# Patient Record
Sex: Female | Born: 1975 | Race: Black or African American | Hispanic: No | Smoking: Former smoker
Health system: Southern US, Community
[De-identification: ages and names within clinical notes are randomized; demographics above are authoritative.]

## PROBLEM LIST (undated history)

## (undated) DIAGNOSIS — J45909 Unspecified asthma, uncomplicated: Secondary | ICD-10-CM

## (undated) DIAGNOSIS — J40 Bronchitis, not specified as acute or chronic: Secondary | ICD-10-CM

## (undated) DIAGNOSIS — F209 Schizophrenia, unspecified: Secondary | ICD-10-CM

## (undated) DIAGNOSIS — F419 Anxiety disorder, unspecified: Secondary | ICD-10-CM

## (undated) DIAGNOSIS — F32A Depression, unspecified: Secondary | ICD-10-CM

## (undated) HISTORY — DX: Schizophrenia, unspecified: F20.9

---

## 2001-01-25 ENCOUNTER — Other Ambulatory Visit: Admission: RE | Admit: 2001-01-25 | Discharge: 2001-01-25 | Payer: Self-pay | Admitting: Obstetrics and Gynecology

## 2001-12-29 ENCOUNTER — Ambulatory Visit (HOSPITAL_COMMUNITY): Admission: RE | Admit: 2001-12-29 | Discharge: 2001-12-29 | Payer: Self-pay | Admitting: Obstetrics and Gynecology

## 2002-03-17 ENCOUNTER — Emergency Department (HOSPITAL_COMMUNITY): Admission: EM | Admit: 2002-03-17 | Discharge: 2002-03-17 | Payer: Self-pay | Admitting: Internal Medicine

## 2004-02-03 ENCOUNTER — Ambulatory Visit (HOSPITAL_COMMUNITY): Admission: RE | Admit: 2004-02-03 | Discharge: 2004-02-03 | Payer: Self-pay | Admitting: Family Medicine

## 2009-01-29 ENCOUNTER — Emergency Department (HOSPITAL_COMMUNITY): Admission: EM | Admit: 2009-01-29 | Discharge: 2009-01-29 | Payer: Self-pay | Admitting: Emergency Medicine

## 2011-01-22 NOTE — Op Note (Signed)
[facility]  Patient:    [name], [name] Visit Number: [id number] MRN: [id number]          Service Type: DSU Location: DAY Attending Physician:  Tilda Burrow Dictated by:   Amy Rowe, M.D. Proc. Date: 12/29/01 Admit Date:  12/29/2001 Discharge Date: 12/29/2001   CC:         Amy Rowe, M.D.   Operative Report  PREOPERATIVE DIAGNOSIS:  Elective sterilization.  POSTOPERATIVE DIAGNOSIS:  Elective sterilization.  OPERATION/PROCEDURE:  Laparoscopic tubal sterilization with Falope rings.  SURGEON:  Amy Rowe, M.D.  ASSISTANT:  None.  ANESTHESIA:  General.  COMPLICATIONS:  None.  ESTIMATED BLOOD LOSS:  Minimal.  INDICATION:  Elective permanent sterilization.  DETAILS OF PROCEDURE:  The patient was taken to the operating room, prepped and draped for a combined abdominal and vaginal procedure, with Hulka tenaculum attached to the cervix for uterine manipulation. Bladder in-and-out catheterization. An infraumbilical, 1 cm vertical incision, as well as a transverse suprapubic 1 cm incision. Veress needle was used to introduce pneumoperitoneum through the umbilical incision with the pneumoperitoneum easily introduced under 10 mmHg of pressure. Introduction of the Veress needle was done, carefully elevating the abdominal wall and orienting the needle toward the pelvis.  The laparoscopic trocar was then carefully introduced into the abdomen using a similar technique, and the laparoscope was used to visualize normal pelvic anatomy with no evidence of bleeding or trauma. The suprapubic trocar was introduced under direct visualization, and then attention was directed to the left fallopian tube, which was identified up to its fimbriated end, elevated and a mid-segment loop of the tube was drawn up into the Falope ring applier, Marcaine 0.25% applied to the surface of the tube and the Falope ring applied, inspected, and found to be in  satisfactory position. The opposite tube was then treated in a similar fashion. The mesosalpinx beneath the Falope ring on each side was then infiltrated with approximately 3 cc of Marcaine 0.25%, using a transabdominal approach with a 22-gauge spinal needle.  Then, the laparoscopic equipment was removed after instilling 200 cc of saline into the abdomen and deflating the abdomen. Subcuticular 4-0 Dexon was used to close the skin incisions and Steri-Strips was placed on the skin surface. Sponge and needle counts were correct. The patient tolerated the procedure well, was awakened, and went to the recovery room in good condition. Dictated by:   Amy Rowe, M.D. Attending Physician:  Tilda Burrow DD:  01/22/02 TD:  01/23/02 Job: [id number]

## 2012-12-23 ENCOUNTER — Encounter (HOSPITAL_COMMUNITY): Payer: Self-pay | Admitting: Emergency Medicine

## 2012-12-23 ENCOUNTER — Emergency Department (HOSPITAL_COMMUNITY)
Admission: EM | Admit: 2012-12-23 | Discharge: 2012-12-23 | Disposition: A | Discharge status: Home / Self Care | Payer: Medicaid Other | Attending: Emergency Medicine | Admitting: Emergency Medicine

## 2012-12-23 DIAGNOSIS — J45909 Unspecified asthma, uncomplicated: Secondary | ICD-10-CM | POA: Insufficient documentation

## 2012-12-23 DIAGNOSIS — S61219A Laceration without foreign body of unspecified finger without damage to nail, initial encounter: Secondary | ICD-10-CM

## 2012-12-23 DIAGNOSIS — Y929 Unspecified place or not applicable: Secondary | ICD-10-CM | POA: Insufficient documentation

## 2012-12-23 DIAGNOSIS — S61209A Unspecified open wound of unspecified finger without damage to nail, initial encounter: Secondary | ICD-10-CM | POA: Insufficient documentation

## 2012-12-23 DIAGNOSIS — Y9389 Activity, other specified: Secondary | ICD-10-CM | POA: Insufficient documentation

## 2012-12-23 DIAGNOSIS — W268XXA Contact with other sharp object(s), not elsewhere classified, initial encounter: Secondary | ICD-10-CM | POA: Insufficient documentation

## 2012-12-23 DIAGNOSIS — Z23 Encounter for immunization: Secondary | ICD-10-CM | POA: Insufficient documentation

## 2012-12-23 DIAGNOSIS — Z79899 Other long term (current) drug therapy: Secondary | ICD-10-CM | POA: Insufficient documentation

## 2012-12-23 HISTORY — DX: Bronchitis, not specified as acute or chronic: J40

## 2012-12-23 HISTORY — DX: Unspecified asthma, uncomplicated: J45.909

## 2012-12-23 MED ORDER — TETANUS-DIPHTH-ACELL PERTUSSIS 5-2.5-18.5 LF-MCG/0.5 IM SUSP
0.5000 mL | Freq: Once | INTRAMUSCULAR | Status: AC
  Administered 2012-12-23: 0.5 mL via INTRAMUSCULAR
  Filled 2012-12-23: qty 0.5

## 2012-12-23 MED ORDER — CEPHALEXIN 500 MG PO CAPS: 500.0000 mg | ORAL_CAPSULE | Freq: Four times a day (QID) | ORAL | Status: DC

## 2012-12-23 MED ORDER — IBUPROFEN 800 MG PO TABS
800.0000 mg | ORAL_TABLET | Freq: Once | ORAL | Status: AC
  Administered 2012-12-23: 800 mg via ORAL
  Filled 2012-12-23: qty 1

## 2012-12-23 MED ORDER — IBUPROFEN 800 MG PO TABS: 800.0000 mg | ORAL_TABLET | Freq: Three times a day (TID) | ORAL | Status: DC

## 2012-12-23 NOTE — ED Notes (Signed)
Pt presents with small laceration to left thumb sustained while attempting to fix a toy for child. Bleeding controlled. Unknown last tetanus booster.  Steri strips applied by EDP. Pt tolerated well.

## 2012-12-23 NOTE — ED Notes (Signed)
Patient reports cut left thumb tonight while using knife to fix something.

## 2012-12-24 NOTE — ED Provider Notes (Signed)
History     CSN: [id number]  Arrival date & time 12/23/12  2140   First MD Initiated Contact with Patient 12/23/12 2158      Chief Complaint  Patient presents with  . Extremity Laceration    (Consider location/radiation/quality/duration/timing/severity/associated sxs/prior treatment) Patient is a 37 y.o. female presenting with skin laceration. The history is provided by the patient.  Laceration Location:  Finger Finger laceration location:  L thumb Length (cm):  2 Depth:  Cutaneous Quality: straight   Bleeding: controlled   Time since incident:  2 hours Laceration mechanism:  Knife Pain details:    Quality:  Throbbing   Severity:  Mild   Timing:  Constant   Progression:  Unchanged Foreign body present:  No foreign bodies Relieved by:  Pressure Worsened by:  Movement Ineffective treatments:  None tried Tetanus status:  Out of date   Past Medical History  Diagnosis Date  . Asthma   . Bronchitis     Past Surgical History  Procedure Laterality Date  . Cesarean section      History reviewed. No pertinent family history.  History  Substance Use Topics  . Smoking status: Never Smoker   . Smokeless tobacco: Not on file  . Alcohol Use: Yes     Comment: socially    OB History   Grav Para Term Preterm Abortions TAB SAB Ect Mult Living                  Review of Systems  Constitutional: Negative for fever and chills.  Musculoskeletal: Negative for back pain, joint swelling and arthralgias.  Skin: Positive for wound.       Laceration   Neurological: Negative for dizziness, weakness and numbness.  Hematological: Does not bruise/bleed easily.  All other systems reviewed and are negative.    Allergies  Latex  Home Medications   Current Outpatient Rx  Name  Route  Sig  Dispense  Refill  . albuterol (PROVENTIL HFA) 108 (90 BASE) MCG/ACT inhaler   Inhalation   Inhale 2 puffs into the lungs every 6 (six) hours as needed for wheezing or shortness of  breath.         . cephALEXin (KEFLEX) 500 MG capsule   Oral   Take 1 capsule (500 mg total) by mouth 4 (four) times daily. For 7 days   28 capsule   0   . ibuprofen (ADVIL,MOTRIN) 800 MG tablet   Oral   Take 1 tablet (800 mg total) by mouth 3 (three) times daily. Take with food   21 tablet   0     BP 132/77  Pulse 80  Temp(Src) 98.5 F (36.9 C) (Oral)  Resp 20  Ht 5\' 3"  (1.6 m)  Wt 201 lb 9.6 oz (91.445 kg)  BMI 35.72 kg/m2  SpO2 95%  LMP 12/13/2012  Physical Exam  Nursing note and vitals reviewed. Constitutional: She is oriented to person, place, and time. She appears well-developed and well-nourished. No distress.  HENT:  Head: Normocephalic and atraumatic.  Cardiovascular: Normal rate, regular rhythm, normal heart sounds and intact distal pulses.   No murmur heard. Pulmonary/Chest: Effort normal and breath sounds normal. No respiratory distress.  Musculoskeletal: She exhibits no edema and no tenderness.       Left hand: She exhibits decreased range of motion, tenderness and laceration. She exhibits no bony tenderness, normal two-point discrimination, normal capillary refill, no deformity and no swelling. Normal sensation noted. Decreased strength noted. She exhibits finger abduction. She  exhibits no wrist extension trouble.       Hands: Neurological: She is alert and oriented to person, place, and time. She exhibits normal muscle tone. Coordination normal.  Skin: Laceration noted.    ED Course  Procedures (including critical care time)  Labs Reviewed - No data to display No results found.   1. Finger laceration involving tendon, initial encounter       MDM    LACERATION REPAIR Performed by: Jesiah Grismer L. Authorized by: Maxwell Caul Consent: Verbal consent obtained. Risks and benefits: risks, benefits and alternatives were discussed Consent given by: patient Patient identity confirmed: provided demographic data  Wound  explored  Laceration Location: dorsal left thumb Laceration Length: 2 cm  No Foreign Bodies seen or palpated  Anesthesia: none  Irrigation method: syringe Amount of cleaning: standard  Skin closure: steri-strips Number : 2   Lac bandaged and splinted, pain improved remains NV intact  Patient tolerance: Patient tolerated the procedure well with no immediate complications.    Small lac to the dorsal left thumb over the distal phalanx.  Patient unable to fully extend the thumb.  Distal sensation is intact.  CR< 2 sec.  Laceration appears superficial but concerning for possible tendon injury although not visualized on exam.  I will splint the thumb, start abx and pt agrees to close f/u next week with orthopedics.  I have discussed exam findings with the patient and she verbalized understanding of instructions and agrees to care plan.     Tayen Narang L. Trisha Mangle, PA-C 12/24/12 2139

## 2012-12-27 NOTE — ED Provider Notes (Signed)
Medical screening examination/treatment/procedure(s) were performed by non-physician practitioner and as supervising physician I was immediately available for consultation/collaboration. Devoria Albe, MD, Armando Gang   Ward Givens, MD 12/27/12 1052

## 2013-06-05 ENCOUNTER — Encounter (HOSPITAL_COMMUNITY): Payer: Self-pay

## 2013-06-05 ENCOUNTER — Emergency Department (HOSPITAL_COMMUNITY)
Admission: EM | Admit: 2013-06-05 | Discharge: 2013-06-05 | Disposition: A | Discharge status: Home / Self Care | Payer: Medicaid Other | Attending: Emergency Medicine | Admitting: Emergency Medicine

## 2013-06-05 DIAGNOSIS — Z791 Long term (current) use of non-steroidal anti-inflammatories (NSAID): Secondary | ICD-10-CM | POA: Insufficient documentation

## 2013-06-05 DIAGNOSIS — F410 Panic disorder [episodic paroxysmal anxiety] without agoraphobia: Secondary | ICD-10-CM | POA: Insufficient documentation

## 2013-06-05 DIAGNOSIS — R079 Chest pain, unspecified: Secondary | ICD-10-CM | POA: Insufficient documentation

## 2013-06-05 DIAGNOSIS — J45901 Unspecified asthma with (acute) exacerbation: Secondary | ICD-10-CM | POA: Insufficient documentation

## 2013-06-05 DIAGNOSIS — Z79899 Other long term (current) drug therapy: Secondary | ICD-10-CM | POA: Insufficient documentation

## 2013-06-05 DIAGNOSIS — Z91040 Latex allergy status: Secondary | ICD-10-CM | POA: Insufficient documentation

## 2013-06-05 HISTORY — DX: Anxiety disorder, unspecified: F41.9

## 2013-06-05 MED ORDER — LORAZEPAM 1 MG PO TABS
1.0000 mg | ORAL_TABLET | Freq: Once | ORAL | Status: AC
  Administered 2013-06-05: 1 mg via ORAL
  Filled 2013-06-05: qty 1

## 2013-06-05 MED ORDER — LORAZEPAM 1 MG PO TABS: 1.0000 mg | ORAL_TABLET | Freq: Three times a day (TID) | ORAL | Status: DC | PRN

## 2013-06-05 NOTE — ED Notes (Signed)
Pt alert & oriented x4, stable gait. Patient given discharge instructions, paperwork & prescription(s). Patient  instructed to stop at the registration desk to finish any additional paperwork. Patient verbalized understanding. Pt left department w/ no further questions. 

## 2013-06-05 NOTE — ED Notes (Signed)
Pt admits to ems that she is having issues with teenage children and with hx of panic attacks in the past, felt like she needed to get out of the situation.

## 2013-06-05 NOTE — ED Provider Notes (Signed)
CSN: [id number]     Arrival date & time 06/05/13  1920 History   First MD Initiated Contact with Patient 06/05/13 1939     Chief Complaint  Patient presents with  . Panic Attack    Patient is a 37 y.o. female presenting with anxiety. The history is provided by the patient.  Anxiety This is a recurrent problem. The current episode started less than 1 hour ago. The problem occurs constantly. The problem has been gradually improving. Associated symptoms include chest pain and shortness of breath. The symptoms are aggravated by stress. The symptoms are relieved by rest.  pt presents from home for panic attack Pt reports increased stress at home and due to this she started to hyperventilate, she felt SOB and had chest tightness.  She had to lay down due to her anxiety.  No injury reported She is now improved She denies SI.  She reports she feels safe at home She reports h/o panic attacks in the past  Her children are at bedside and they confirm story  Past Medical History  Diagnosis Date  . Asthma   . Bronchitis   . Anxiety    Past Surgical History  Procedure Laterality Date  . Cesarean section     No family history on file. History  Substance Use Topics  . Smoking status: Never Smoker   . Smokeless tobacco: Not on file  . Alcohol Use: Yes     Comment: socially   OB History   Grav Para Term Preterm Abortions TAB SAB Ect Mult Living                 Review of Systems  Respiratory: Positive for shortness of breath.   Cardiovascular: Positive for chest pain.  Gastrointestinal: Negative for vomiting.  Psychiatric/Behavioral: Negative for suicidal ideas. The patient is nervous/anxious.   All other systems reviewed and are negative.    Allergies  Latex  Home Medications   Current Outpatient Rx  Name  Route  Sig  Dispense  Refill  . albuterol (PROVENTIL HFA) 108 (90 BASE) MCG/ACT inhaler   Inhalation   Inhale 2 puffs into the lungs every 6 (six) hours as needed for  wheezing or shortness of breath.         . cephALEXin (KEFLEX) 500 MG capsule   Oral   Take 1 capsule (500 mg total) by mouth 4 (four) times daily. For 7 days   28 capsule   0   . ibuprofen (ADVIL,MOTRIN) 800 MG tablet   Oral   Take 1 tablet (800 mg total) by mouth 3 (three) times daily. Take with food   21 tablet   0    BP 103/71  Pulse 59  Temp(Src) 98.6 F (37 C) (Oral)  Resp 20  Ht 5\' 3"  (1.6 m)  Wt 201 lb (91.173 kg)  BMI 35.61 kg/m2  SpO2 100%  LMP 06/04/2013  Physical Exam CONSTITUTIONAL: Well developed/well nourished HEAD: Normocephalic/atraumatic EYES: EOMI/PERRL ENMT: Mucous membranes moist NECK: supple no meningeal signs SPINE:entire spine nontender CV: S1/S2 noted, no murmurs/rubs/gallops noted LUNGS: Lungs are clear to auscultation bilaterally, no apparent distress ABDOMEN: soft, nontender, no rebound or guarding GU:no cva tenderness NEURO: Pt is awake/alert, moves all extremitiesx4 EXTREMITIES: pulses normal, full ROM SKIN: warm, color normal PSYCH: mildly anxious  ED Course  Procedures  Pt with panic attack at home due to family stress (reports before episode she was baseline) I doubt ACS/PE as cause of her symptoms Now that she is  in hospital, her anxiety is resolving short course of ativan ordered.   She feels safe for d/c home  MDM  No diagnosis found. Nursing notes including past medical history and social history reviewed and considered in documentation    Date: 06/05/2013  Rate: 62  Rhythm: normal sinus rhythm  QRS Axis: normal  Intervals: normal  ST/T Wave abnormalities: nonspecific ST changes  Conduction Disutrbances:none  Narrative Interpretation:   Old EKG Reviewed: none available at time of interpretation    Joya Gaskins, MD 06/05/13 2151

## 2014-02-22 ENCOUNTER — Emergency Department (HOSPITAL_COMMUNITY)
Admission: EM | Admit: 2014-02-22 | Discharge: 2014-02-22 | Disposition: A | Discharge status: Home / Self Care | Payer: MEDICAID | Attending: Emergency Medicine | Admitting: Emergency Medicine

## 2014-02-22 ENCOUNTER — Encounter (HOSPITAL_COMMUNITY): Payer: Self-pay | Admitting: Emergency Medicine

## 2014-02-22 DIAGNOSIS — Z79899 Other long term (current) drug therapy: Secondary | ICD-10-CM | POA: Insufficient documentation

## 2014-02-22 DIAGNOSIS — F410 Panic disorder [episodic paroxysmal anxiety] without agoraphobia: Secondary | ICD-10-CM | POA: Insufficient documentation

## 2014-02-22 DIAGNOSIS — Z91040 Latex allergy status: Secondary | ICD-10-CM | POA: Diagnosis not present

## 2014-02-22 DIAGNOSIS — J45909 Unspecified asthma, uncomplicated: Secondary | ICD-10-CM | POA: Insufficient documentation

## 2014-02-22 MED ORDER — LORAZEPAM 1 MG PO TABS
1.0000 mg | ORAL_TABLET | Freq: Once | ORAL | Status: AC
  Administered 2014-02-22: 1 mg via ORAL
  Filled 2014-02-22: qty 1

## 2014-02-22 NOTE — ED Notes (Signed)
Pt. Appears anxious and tearful. Pt. Appears restless. Pt. Reports taking celexa tonight and drinking alcohol. Pt. Denies SI and HI. [facility]

## 2014-02-22 NOTE — ED Provider Notes (Deleted)
CSN: [id number]     Arrival date & time 02/22/14  0215 History   First MD Initiated Contact with Patient 02/22/14 0222     Chief Complaint  Patient presents with  . Panic Attack      HPI Patient reports she is become overwhelmed with distress her life.  This came to have this evening.  She presents tearful and anxious.  She denies chest pain shortness of breath.  She has no homicidal or suicidal thoughts.  She denies recent illness.  She states no one is physically, verbally, sexually abusing her.  She has a safe place to stay.  She does admit to drinking alcohol this evening he reports having 2 drinks.   Past Medical History  Diagnosis Date  . Asthma   . Bronchitis   . Anxiety    Past Surgical History  Procedure Laterality Date  . Cesarean section     No family history on file. History  Substance Use Topics  . Smoking status: Never Smoker   . Smokeless tobacco: Not on file  . Alcohol Use: Yes     Comment: socially   OB History   Grav Para Term Preterm Abortions TAB SAB Ect Mult Living                 Review of Systems  All other systems reviewed and are negative.     Allergies  Other and Latex  Home Medications   Prior to Admission medications   Medication Sig Start Date End Date Taking? Authorizing Provider  citalopram (CELEXA) 20 MG tablet Take 20 mg by mouth daily.   Yes Historical Provider, MD  hydrOXYzine (ATARAX/VISTARIL) 25 MG tablet Take 25 mg by mouth 3 (three) times daily as needed.   Yes Historical Provider, MD  traZODone (DESYREL) 100 MG tablet Take 100 mg by mouth at bedtime.   Yes Historical Provider, MD   BP 140/86  Pulse 86  Temp(Src) 97.8 F (36.6 C) (Oral)  Resp 18  Ht 5\' 9"  (1.753 m)  Wt 209 lb (94.802 kg)  BMI 30.85 kg/m2  SpO2 99% Physical Exam  Nursing note and vitals reviewed. Constitutional: She is oriented to person, place, and time. She appears well-developed and well-nourished. No distress.  HENT:  Head: Normocephalic and  atraumatic.  Eyes: EOM are normal.  Neck: Normal range of motion.  Cardiovascular: Normal rate, regular rhythm and normal heart sounds.   Pulmonary/Chest: Effort normal and breath sounds normal.  Abdominal: Soft. She exhibits no distension. There is no tenderness.  Musculoskeletal: Normal range of motion.  Neurological: She is alert and oriented to person, place, and time.  Skin: Skin is warm and dry.  Psychiatric:  tearful    ED Course  Procedures (including critical care time) Labs Review Labs Reviewed - No data to display  Imaging Review No results found.   EKG Interpretation None      MDM   Final diagnoses:  Panic attack    3:16 AM Pt feeling better at this time. Dc home. Panic attack. Will follow up with her outpatient therapist/psychiatric services today    Lyanne CoKevin M Campos, MD 02/22/14 [phone]

## 2014-02-22 NOTE — ED Provider Notes (Signed)
CSN: [id number]     Arrival date & time 02/22/14  0215 History   First MD Initiated Contact with Patient 02/22/14 0222     Chief Complaint  Patient presents with  . Panic Attack     HPI Patient presents the emergency department stay she feels overwhelmed with all the stress in her life.  She is tearful and anxious at this time.  She is taking the medications prescribed by her therapist and psychiatrist which include Celexa, trazodone, Vistaril.  As reported she had 2 drinks of alcohol this evening.  No homicidal or suicidal thoughts.  No pain.  No complaints otherwise.   Past Medical History  Diagnosis Date  . Asthma   . Bronchitis   . Anxiety    Past Surgical History  Procedure Laterality Date  . Cesarean section     No family history on file. History  Substance Use Topics  . Smoking status: Never Smoker   . Smokeless tobacco: Not on file  . Alcohol Use: Yes     Comment: socially   OB History   Grav Para Term Preterm Abortions TAB SAB Ect Mult Living                 Review of Systems  All other systems reviewed and are negative.     Allergies  Other and Latex  Home Medications   Prior to Admission medications   Medication Sig Start Date End Date Taking? Authorizing Provider  citalopram (CELEXA) 20 MG tablet Take 20 mg by mouth daily.   Yes Historical Provider, MD  hydrOXYzine (ATARAX/VISTARIL) 25 MG tablet Take 25 mg by mouth 3 (three) times daily as needed.   Yes Historical Provider, MD  traZODone (DESYREL) 100 MG tablet Take 100 mg by mouth at bedtime.   Yes Historical Provider, MD   BP 140/86  Pulse 86  Temp(Src) 97.8 F (36.6 C) (Oral)  Resp 18  Ht 5\' 9"  (1.753 m)  Wt 209 lb (94.802 kg)  BMI 30.85 kg/m2  SpO2 99% Physical Exam  Nursing note and vitals reviewed. Constitutional: She is oriented to person, place, and time. She appears well-developed and well-nourished. No distress.  HENT:  Head: Normocephalic and atraumatic.  Eyes: EOM are normal.   Neck: Normal range of motion.  Cardiovascular: Normal rate, regular rhythm and normal heart sounds.   Pulmonary/Chest: Effort normal and breath sounds normal.  Abdominal: Soft. She exhibits no distension. There is no tenderness.  Musculoskeletal: Normal range of motion.  Neurological: She is alert and oriented to person, place, and time.  Skin: Skin is warm and dry.  Psychiatric:  Tearful and anxious    ED Course  Procedures (including critical care time) Labs Review Labs Reviewed - No data to display  Imaging Review No results found.   EKG Interpretation None      MDM   Final diagnoses:  Panic attack    3:14 AM Improvement in symptoms. Pt will follow up with her therapist/psychiatric team today    Lyanne CoKevin M Campos, MD 02/22/14 [phone]

## 2014-02-22 NOTE — ED Notes (Signed)
Pt. No longer tearful. Pt. Does not appear anxious. Pt. Reports she will follow-up with [facility] tomorrow.

## 2015-08-13 ENCOUNTER — Inpatient Hospital Stay (HOSPITAL_COMMUNITY)
Admission: AD | Admit: 2015-08-13 | Discharge: 2015-08-19 | DRG: 885 | Disposition: A | Discharge status: Home / Self Care | Payer: No Typology Code available for payment source | Source: Intra-hospital | Attending: Psychiatry | Admitting: Psychiatry

## 2015-08-13 ENCOUNTER — Emergency Department (HOSPITAL_COMMUNITY)
Admission: EM | Admit: 2015-08-13 | Discharge: 2015-08-13 | Disposition: A | Discharge status: Other Acute Inpatient Hospital | Payer: Medicaid Other | Attending: Emergency Medicine | Admitting: Emergency Medicine

## 2015-08-13 ENCOUNTER — Encounter (HOSPITAL_COMMUNITY): Payer: Self-pay

## 2015-08-13 DIAGNOSIS — F339 Major depressive disorder, recurrent, unspecified: Secondary | ICD-10-CM | POA: Diagnosis not present

## 2015-08-13 DIAGNOSIS — R45851 Suicidal ideations: Secondary | ICD-10-CM | POA: Diagnosis present

## 2015-08-13 DIAGNOSIS — G4700 Insomnia, unspecified: Secondary | ICD-10-CM | POA: Diagnosis present

## 2015-08-13 DIAGNOSIS — F329 Major depressive disorder, single episode, unspecified: Secondary | ICD-10-CM | POA: Insufficient documentation

## 2015-08-13 DIAGNOSIS — F332 Major depressive disorder, recurrent severe without psychotic features: Secondary | ICD-10-CM | POA: Diagnosis not present

## 2015-08-13 DIAGNOSIS — F410 Panic disorder [episodic paroxysmal anxiety] without agoraphobia: Secondary | ICD-10-CM | POA: Diagnosis present

## 2015-08-13 DIAGNOSIS — Z91040 Latex allergy status: Secondary | ICD-10-CM | POA: Insufficient documentation

## 2015-08-13 DIAGNOSIS — E876 Hypokalemia: Secondary | ICD-10-CM | POA: Insufficient documentation

## 2015-08-13 DIAGNOSIS — J45909 Unspecified asthma, uncomplicated: Secondary | ICD-10-CM | POA: Insufficient documentation

## 2015-08-13 DIAGNOSIS — F419 Anxiety disorder, unspecified: Secondary | ICD-10-CM | POA: Insufficient documentation

## 2015-08-13 DIAGNOSIS — Z3202 Encounter for pregnancy test, result negative: Secondary | ICD-10-CM | POA: Insufficient documentation

## 2015-08-13 DIAGNOSIS — Z79899 Other long term (current) drug therapy: Secondary | ICD-10-CM | POA: Insufficient documentation

## 2015-08-13 DIAGNOSIS — K5900 Constipation, unspecified: Secondary | ICD-10-CM | POA: Diagnosis present

## 2015-08-13 DIAGNOSIS — F209 Schizophrenia, unspecified: Secondary | ICD-10-CM | POA: Diagnosis present

## 2015-08-13 DIAGNOSIS — F32A Depression, unspecified: Secondary | ICD-10-CM

## 2015-08-13 LAB — CBC WITH DIFFERENTIAL/PLATELET
BASOS ABS: 0 10*3/uL (ref 0.0–0.1)
BASOS PCT: 0 %
Eosinophils Absolute: 0 10*3/uL (ref 0.0–0.7)
Eosinophils Relative: 1 %
HEMATOCRIT: 41 % (ref 36.0–46.0)
HEMOGLOBIN: 14 g/dL (ref 12.0–15.0)
Lymphocytes Relative: 40 %
Lymphs Abs: 2.2 10*3/uL (ref 0.7–4.0)
MCH: 28.7 pg (ref 26.0–34.0)
MCHC: 34.1 g/dL (ref 30.0–36.0)
MCV: 84.2 fL (ref 78.0–100.0)
MONOS PCT: 5 %
Monocytes Absolute: 0.3 10*3/uL (ref 0.1–1.0)
NEUTROS ABS: 3 10*3/uL (ref 1.7–7.7)
NEUTROS PCT: 54 %
Platelets: 228 10*3/uL (ref 150–400)
RBC: 4.87 MIL/uL (ref 3.87–5.11)
RDW: 14.2 % (ref 11.5–15.5)
WBC: 5.5 10*3/uL (ref 4.0–10.5)

## 2015-08-13 LAB — URINALYSIS, ROUTINE W REFLEX MICROSCOPIC
BILIRUBIN URINE: NEGATIVE
GLUCOSE, UA: NEGATIVE mg/dL
KETONES UR: NEGATIVE mg/dL
LEUKOCYTES UA: NEGATIVE
Nitrite: NEGATIVE
PH: 6 (ref 5.0–8.0)
Protein, ur: NEGATIVE mg/dL
Specific Gravity, Urine: <1.005 — ABNORMAL LOW (ref 1.005–1.030)

## 2015-08-13 LAB — SALICYLATE LEVEL

## 2015-08-13 LAB — RAPID URINE DRUG SCREEN, HOSP PERFORMED
Amphetamines: NOT DETECTED
BARBITURATES: NOT DETECTED
BENZODIAZEPINES: NOT DETECTED
Cocaine: NOT DETECTED
Opiates: NOT DETECTED
Tetrahydrocannabinol: NOT DETECTED

## 2015-08-13 LAB — COMPREHENSIVE METABOLIC PANEL
ALT: 15 U/L (ref 14–54)
AST: 19 U/L (ref 15–41)
Albumin: 3.7 g/dL (ref 3.5–5.0)
Alkaline Phosphatase: 48 U/L (ref 38–126)
Anion gap: 10 (ref 5–15)
BUN: 8 mg/dL (ref 6–20)
CHLORIDE: 111 mmol/L (ref 101–111)
CO2: 16 mmol/L — AB (ref 22–32)
CREATININE: 0.84 mg/dL (ref 0.44–1.00)
Calcium: 9.2 mg/dL (ref 8.9–10.3)
GFR calc non Af Amer: >60 mL/min (ref >60)
Glucose, Bld: 101 mg/dL — ABNORMAL HIGH (ref 65–99)
Potassium: 3.3 mmol/L — ABNORMAL LOW (ref 3.5–5.1)
SODIUM: 137 mmol/L (ref 135–145)
Total Bilirubin: 0.7 mg/dL (ref 0.3–1.2)
Total Protein: 7.4 g/dL (ref 6.5–8.1)

## 2015-08-13 LAB — URINE MICROSCOPIC-ADD ON: WBC UA: NONE SEEN WBC/hpf (ref 0–5)

## 2015-08-13 LAB — ETHANOL: Alcohol, Ethyl (B): 70 mg/dL — ABNORMAL HIGH (ref <5)

## 2015-08-13 LAB — ACETAMINOPHEN LEVEL

## 2015-08-13 LAB — PREGNANCY, URINE: Preg Test, Ur: NEGATIVE

## 2015-08-13 MED ORDER — ZOLPIDEM TARTRATE 5 MG PO TABS: 10.0000 mg | ORAL_TABLET | Freq: Every evening | ORAL | Status: DC | PRN

## 2015-08-13 MED ORDER — POTASSIUM CHLORIDE CRYS ER 20 MEQ PO TBCR
40.0000 meq | EXTENDED_RELEASE_TABLET | Freq: Three times a day (TID) | ORAL | Status: DC
  Administered 2015-08-13: 40 meq via ORAL
  Filled 2015-08-13: qty 2

## 2015-08-13 MED ORDER — IBUPROFEN 400 MG PO TABS: 600.0000 mg | ORAL_TABLET | Freq: Three times a day (TID) | ORAL | Status: DC | PRN

## 2015-08-13 MED ORDER — TRAZODONE HCL 100 MG PO TABS
100.0000 mg | ORAL_TABLET | Freq: Every day | ORAL | Status: DC
  Filled 2015-08-13 (×2): qty 1

## 2015-08-13 MED ORDER — POTASSIUM CHLORIDE CRYS ER 10 MEQ PO TBCR
10.0000 meq | EXTENDED_RELEASE_TABLET | Freq: Two times a day (BID) | ORAL | Status: AC
  Administered 2015-08-13 – 2015-08-14 (×3): 10 meq via ORAL
  Filled 2015-08-13 (×4): qty 1

## 2015-08-13 MED ORDER — LORAZEPAM 1 MG PO TABS: 1.0000 mg | ORAL_TABLET | Freq: Three times a day (TID) | ORAL | Status: DC | PRN

## 2015-08-13 MED ORDER — ALUM & MAG HYDROXIDE-SIMETH 200-200-20 MG/5ML PO SUSP: 30.0000 mL | ORAL | Status: DC | PRN

## 2015-08-13 MED ORDER — LORATADINE 10 MG PO TABS
10.0000 mg | ORAL_TABLET | Freq: Every day | ORAL | Status: DC
Start: 2015-08-13 — End: 2015-08-19
  Administered 2015-08-13 – 2015-08-19 (×7): 10 mg via ORAL
  Filled 2015-08-13 (×9): qty 1

## 2015-08-13 MED ORDER — ONDANSETRON HCL 4 MG PO TABS: 4.0000 mg | ORAL_TABLET | Freq: Three times a day (TID) | ORAL | Status: DC | PRN

## 2015-08-13 MED ORDER — NICOTINE 21 MG/24HR TD PT24: 21.0000 mg | MEDICATED_PATCH | Freq: Every day | TRANSDERMAL | Status: DC

## 2015-08-13 MED ORDER — MAGNESIUM HYDROXIDE 400 MG/5ML PO SUSP: 30.0000 mL | Freq: Every day | ORAL | Status: DC | PRN

## 2015-08-13 MED ORDER — IBUPROFEN 200 MG PO TABS: 200.0000 mg | ORAL_TABLET | Freq: Four times a day (QID) | ORAL | Status: DC | PRN

## 2015-08-13 MED ORDER — HYDROXYZINE HCL 25 MG PO TABS
25.0000 mg | ORAL_TABLET | Freq: Three times a day (TID) | ORAL | Status: DC | PRN
  Administered 2015-08-14 – 2015-08-18 (×6): 25 mg via ORAL
  Filled 2015-08-13 (×6): qty 1
  Filled 2015-08-13: qty 10

## 2015-08-13 MED ORDER — ACETAMINOPHEN 325 MG PO TABS: 650.0000 mg | ORAL_TABLET | Freq: Four times a day (QID) | ORAL | Status: DC | PRN

## 2015-08-13 MED ORDER — INFLUENZA VAC SPLIT QUAD 0.5 ML IM SUSY
0.5000 mL | PREFILLED_SYRINGE | INTRAMUSCULAR | Status: AC
  Administered 2015-08-14: 0.5 mL via INTRAMUSCULAR
  Filled 2015-08-13: qty 0.5

## 2015-08-13 MED ORDER — NICOTINE 21 MG/24HR TD PT24
21.0000 mg | MEDICATED_PATCH | Freq: Every day | TRANSDERMAL | Status: DC
  Filled 2015-08-13 (×2): qty 1

## 2015-08-13 MED ORDER — ACETAMINOPHEN 325 MG PO TABS: 650.0000 mg | ORAL_TABLET | ORAL | Status: DC | PRN

## 2015-08-13 MED ORDER — CITALOPRAM HYDROBROMIDE 20 MG PO TABS
20.0000 mg | ORAL_TABLET | Freq: Every day | ORAL | Status: DC
Start: 2015-08-13 — End: 2015-08-19
  Administered 2015-08-13 – 2015-08-19 (×7): 20 mg via ORAL
  Filled 2015-08-13 (×4): qty 1
  Filled 2015-08-13: qty 7
  Filled 2015-08-13 (×5): qty 1

## 2015-08-13 MED ORDER — TRAZODONE HCL 50 MG PO TABS
50.0000 mg | ORAL_TABLET | Freq: Every evening | ORAL | Status: DC | PRN
  Administered 2015-08-14 – 2015-08-18 (×5): 50 mg via ORAL
  Filled 2015-08-13 (×3): qty 1
  Filled 2015-08-13: qty 7
  Filled 2015-08-13 (×2): qty 1

## 2015-08-13 NOTE — ED Notes (Signed)
Patient refuses breakfast tray at this time

## 2015-08-13 NOTE — ED Provider Notes (Signed)
  Physical Exam  BP 114/79 mmHg  Pulse 66  Temp(Src) 98.4 F (36.9 C) (Oral)  Resp 12  Ht 5\' 3"  (1.6 m)  Wt 211 lb 2 oz (95.766 kg)  BMI 37.41 kg/m2  SpO2 100%  LMP 08/06/2015  Physical Exam  ED Course  Procedures  MDM Patient has been accepted at behavioral health by Dr. Jonni Sangerobos      Kai Calico, MD 08/13/15 [phone]

## 2015-08-13 NOTE — H&P (Signed)
Psychiatric Admission Assessment Adult  Patient Identification: Amy Rowe MRN:  [id number] Date of Evaluation:  08/13/2015 Chief Complaint:   " I have been overwhelmed " Principal Diagnosis:  Major Depression, Recurrent, Severe Diagnosis:   Patient Active Problem List   Diagnosis Date Noted  . MDD (major depressive disorder) ([place]) [F32.9] 08/13/2015  . Major depressive disorder, recurrent episode ([place]) [F33.9] 08/13/2015   History of Present Illness:: Patient is a 39 year old female. States " I have been dealing with a lot of stuff, and I have been feeling very rough and overwhelmed over the last couple of weeks".  States she is involved in a long term relationship with a man, and that he tends to be hyper critical, with a tendency to put her down, belittle her. Also, she feels he has been cold and distant lately, avoiding her. States she recently found out that he " is interested in someone else". Another stressor is that an adult daughter, who lives in [place], is currently hospitalized in a psychiatric unit. Patient states " she has Schizophrenia" States she has been feeling depressed . States she developed suicidal ideations, mostly passive thoughts, but also developed ideations of walking in front of a train. Patient states " I got home and I was hysterical, crying , and my daughter got concerned and called a crisis hotline" so that patient was brought to the hospital. Patient states she rarely drinks, but does state she had been drinking prior to admission. Admission BAL 70.  Associated Signs/Symptoms: Depression Symptoms:  depressed mood, insomnia, recurrent thoughts of death, suicidal thoughts with specific plan, loss of energy/fatigue, decreased appetite, describes low sense of self esteem (Hypo) Manic Symptoms:  States she feels vaguely irritable, but does not endorse or present with symptoms suggestive of mania or hypomania  Anxiety Symptoms:  Describes tendency to worry  excessively and occasional panic attacks, denies agoraphobia Psychotic Symptoms:  denies PTSD Symptoms: At this time does not endorse PTSD symptoms Total Time spent with patient: 45 minutes  Past Psychiatric History:  This is her first psychiatric admission, no history of suicide attempts, denies history of mania, hypomania, patient states she has had prior episodes of depression in the past but feels this is the worse one she has had thus far. Denies psychosis, denies history of violence. Endorses excessive worrying , occasional panic attacks, but not agoraphobia. She has been seeing a therapist, at [facility], whom she sees regularly, but states " I have not seen her recently". States she had been on CELEXA , TRAZODONE, but states she stopped these 6 months ago, because she was feeling better.   Risk to Self: Is patient at risk for suicide?: Yes Risk to Others:   Prior Inpatient Therapy:   Prior Outpatient Therapy:    Alcohol Screening: 1. How often do you have a drink containing alcohol?: Monthly or less 2. How many drinks containing alcohol do you have on a typical day when you are drinking?: 1 or 2 3. How often do you have six or more drinks on one occasion?: Never Preliminary Score: 0 9. Have you or someone else been injured as a result of your drinking?: No 10. Has a relative or friend or a doctor or another health worker been concerned about your drinking or suggested you cut down?: No Alcohol Use Disorder Identification Test Final Score (AUDIT): 1 Brief Intervention: AUDIT score less than 7 or less-screening does not suggest unhealthy drinking-brief intervention not indicated Substance Abuse History in the  last 12 months: denies any drug or alcohol abuse  Consequences of Substance Abuse: Denies  Previous Psychotropic Medications:  States she was on Celexa , which she stopped 6 months ago, does not remember having side effects. Was also on Xanax years ago. Psychological  Evaluations:  No  Past Medical History:  Denies any other medical illnesses, NKDA, does not smoke cigarettes  Past Medical History  Diagnosis Date  . Asthma   . Bronchitis   . Anxiety     Past Surgical History  Procedure Laterality Date  . Cesarean section     Family History:  Parents deceased, states she never met her biological father, mother died in a house fire in 11/25/1999. Has one brother. Psychiatric History: States she had an aunt who went to [facility], but is unsure of diagnosis, suspects Bipolar. States aunt attempted suicide . Mother abused cocaine and opiates . Social History:  Divorced x 7 years, has three children, two adults, and a one 41 year old daughter, who is currently, as above, in a psychiatric hospital in [place]. She had been living with her father.  She works at [employer], denies legal issues, as noted she is facing relationship stressors.  History  Alcohol Use  . Yes    Comment: socially     History  Drug Use No    Social History   Social History  . Marital Status: Divorced    Spouse Name: N/A  . Number of Children: N/A  . Years of Education: N/A   Social History Main Topics  . Smoking status: Never Smoker   . Smokeless tobacco: None  . Alcohol Use: Yes     Comment: socially  . Drug Use: No  . Sexual Activity: Not Asked   Other Topics Concern  . None   Social History Narrative   Additional Social History:    Pain Medications: denies Prescriptions: denies Over the Counter: denies History of alcohol / drug use?: No history of alcohol / drug abuse Longest period of sobriety (when/how long): na  Allergies:   Allergies  Allergen Reactions  . Other     ANTIBIOTIC-Name is unknown. States that reaction occurred along time ago that included having an infection after  . Latex Swelling and Rash   Lab Results:  Results for orders placed or performed during the hospital encounter of 08/13/15 (from the past 48 hour(s))   Comprehensive metabolic panel     Status: Abnormal   Collection Time: 08/13/15  6:00 AM  Result Value Ref Range   Sodium 137 135 - 145 mmol/L   Potassium 3.3 (L) 3.5 - 5.1 mmol/L   Chloride 111 101 - 111 mmol/L   CO2 16 (L) 22 - 32 mmol/L   Glucose, Bld 101 (H) 65 - 99 mg/dL   BUN 8 6 - 20 mg/dL   Creatinine, Ser 0.84 0.44 - 1.00 mg/dL   Calcium 9.2 8.9 - 10.3 mg/dL   Total Protein 7.4 6.5 - 8.1 g/dL   Albumin 3.7 3.5 - 5.0 g/dL   AST 19 15 - 41 U/L   ALT 15 14 - 54 U/L   Alkaline Phosphatase 48 38 - 126 U/L   Total Bilirubin 0.7 0.3 - 1.2 mg/dL   GFR calc non Af Amer >60 >60 mL/min   GFR calc Af Amer >60 >60 mL/min    Comment: (NOTE) The eGFR has been calculated using the CKD EPI equation. This calculation has not been validated in all clinical situations.  eGFR's persistently <60 mL/min signify possible Chronic Kidney Disease.    Anion gap 10 5 - 15  Ethanol     Status: Abnormal   Collection Time: 08/13/15  6:00 AM  Result Value Ref Range   Alcohol, Ethyl (B) 70 (H) <5 mg/dL    Comment:        LOWEST DETECTABLE LIMIT FOR SERUM ALCOHOL IS 5 mg/dL FOR MEDICAL PURPOSES ONLY   Acetaminophen level     Status: Abnormal   Collection Time: 08/13/15  6:00 AM  Result Value Ref Range   Acetaminophen (Tylenol), Serum <10 (L) 10 - 30 ug/mL    Comment:        THERAPEUTIC CONCENTRATIONS VARY SIGNIFICANTLY. A RANGE OF 10-30 ug/mL MAY BE AN EFFECTIVE CONCENTRATION FOR MANY PATIENTS. HOWEVER, SOME ARE BEST TREATED AT CONCENTRATIONS OUTSIDE THIS RANGE. ACETAMINOPHEN CONCENTRATIONS >150 ug/mL AT 4 HOURS AFTER INGESTION AND >50 ug/mL AT 12 HOURS AFTER INGESTION ARE OFTEN ASSOCIATED WITH TOXIC REACTIONS.   Salicylate level     Status: None   Collection Time: 08/13/15  6:00 AM  Result Value Ref Range   Salicylate Lvl <5.3 2.8 - 30.0 mg/dL  CBC with Differential     Status: None   Collection Time: 08/13/15  6:00 AM  Result Value Ref Range   WBC 5.5 4.0 - 10.5 K/uL   RBC 4.87  3.87 - 5.11 MIL/uL   Hemoglobin 14.0 12.0 - 15.0 g/dL   HCT 41.0 36.0 - 46.0 %   MCV 84.2 78.0 - 100.0 fL   MCH 28.7 26.0 - 34.0 pg   MCHC 34.1 30.0 - 36.0 g/dL   RDW 14.2 11.5 - 15.5 %   Platelets 228 150 - 400 K/uL   Neutrophils Relative % 54 %   Neutro Abs 3.0 1.7 - 7.7 K/uL   Lymphocytes Relative 40 %   Lymphs Abs 2.2 0.7 - 4.0 K/uL   Monocytes Relative 5 %   Monocytes Absolute 0.3 0.1 - 1.0 K/uL   Eosinophils Relative 1 %   Eosinophils Absolute 0.0 0.0 - 0.7 K/uL   Basophils Relative 0 %   Basophils Absolute 0.0 0.0 - 0.1 K/uL  Pregnancy, urine     Status: None   Collection Time: 08/13/15  6:10 AM  Result Value Ref Range   Preg Test, Ur NEGATIVE NEGATIVE  Urinalysis, Routine w reflex microscopic     Status: Abnormal   Collection Time: 08/13/15  6:10 AM  Result Value Ref Range   Color, Urine YELLOW YELLOW   APPearance CLEAR CLEAR   Specific Gravity, Urine <1.005 (L) 1.005 - 1.030   pH 6.0 5.0 - 8.0   Glucose, UA NEGATIVE NEGATIVE mg/dL   Hgb urine dipstick TRACE (A) NEGATIVE   Bilirubin Urine NEGATIVE NEGATIVE   Ketones, ur NEGATIVE NEGATIVE mg/dL   Protein, ur NEGATIVE NEGATIVE mg/dL   Nitrite NEGATIVE NEGATIVE   Leukocytes, UA NEGATIVE NEGATIVE  Urine microscopic-add on     Status: Abnormal   Collection Time: 08/13/15  6:10 AM  Result Value Ref Range   Squamous Epithelial / LPF 0-5 (A) NONE SEEN   WBC, UA NONE SEEN 0 - 5 WBC/hpf   RBC / HPF 0-5 0 - 5 RBC/hpf   Bacteria, UA FEW (A) NONE SEEN  Urine rapid drug screen (hosp performed)     Status: None   Collection Time: 08/13/15  6:15 AM  Result Value Ref Range   Opiates NONE DETECTED NONE DETECTED   Cocaine NONE DETECTED NONE DETECTED  Benzodiazepines NONE DETECTED NONE DETECTED   Amphetamines NONE DETECTED NONE DETECTED   Tetrahydrocannabinol NONE DETECTED NONE DETECTED   Barbiturates NONE DETECTED NONE DETECTED    Comment:        DRUG SCREEN FOR MEDICAL PURPOSES ONLY.  IF CONFIRMATION IS NEEDED FOR ANY  PURPOSE, NOTIFY LAB WITHIN 5 DAYS.        LOWEST DETECTABLE LIMITS FOR URINE DRUG SCREEN Drug Class       Cutoff (ng/mL) Amphetamine      1000 Barbiturate      200 Benzodiazepine   616 Tricyclics       073 Opiates          300 Cocaine          300 THC              50     Metabolic Disorder Labs:  No results found for: HGBA1C, MPG No results found for: PROLACTIN No results found for: CHOL, TRIG, HDL, CHOLHDL, VLDL, LDLCALC  Current Medications: Current Facility-Administered Medications  Medication Dose Route Frequency Provider Last Rate Last Dose  . acetaminophen (TYLENOL) tablet 650 mg  650 mg Oral Q6H PRN Kerrie Buffalo, NP      . alum & mag hydroxide-simeth (MAALOX/MYLANTA) 200-200-20 MG/5ML suspension 30 mL  30 mL Oral Q4H PRN Kerrie Buffalo, NP      . hydrOXYzine (ATARAX/VISTARIL) tablet 25 mg  25 mg Oral TID PRN Kerrie Buffalo, NP      . ibuprofen (ADVIL,MOTRIN) tablet 200 mg  200 mg Oral Q6H PRN Kerrie Buffalo, NP      . Derrill Memo ON 08/14/2015] Influenza vac split quadrivalent PF (FLUARIX) injection 0.5 mL  0.5 mL Intramuscular Tomorrow-1000 Myer Peer Miosha Behe, MD      . loratadine (CLARITIN) tablet 10 mg  10 mg Oral Daily Encarnacion Slates, NP   10 mg at 08/13/15 1454  . magnesium hydroxide (MILK OF MAGNESIA) suspension 30 mL  30 mL Oral Daily PRN Kerrie Buffalo, NP      . Derrill Memo ON 08/14/2015] nicotine (NICODERM CQ - dosed in mg/24 hours) patch 21 mg  21 mg Transdermal Q0600 Encarnacion Slates, NP      . traZODone (DESYREL) tablet 100 mg  100 mg Oral QHS Kerrie Buffalo, NP       PTA Medications: Prescriptions prior to admission  Medication Sig Dispense Refill Last Dose  . cetirizine (ZYRTEC) 10 MG tablet Take 10 mg by mouth daily.   08/12/2015 at Unknown time  . citalopram (CELEXA) 20 MG tablet Take 40 mg by mouth daily.    unknown  . hydrOXYzine (ATARAX/VISTARIL) 25 MG tablet Take 25 mg by mouth 3 (three) times daily as needed for anxiety.    unknown  . ibuprofen (ADVIL,MOTRIN) 200  MG tablet Take 200 mg by mouth every 6 (six) hours as needed.   Past Week at Unknown time  . traZODone (DESYREL) 100 MG tablet Take 100 mg by mouth at bedtime.   unknown    Musculoskeletal: Strength & Muscle Tone: within normal limits Gait & Station: normal Patient leans: N/A  Psychiatric Specialty Exam: Physical Exam  Review of Systems  Constitutional: Negative.   HENT: Negative.   Eyes: Negative.   Respiratory: Negative.   Cardiovascular: Negative.   Gastrointestinal: Positive for constipation. Negative for nausea and vomiting.  Genitourinary: Negative.   Musculoskeletal: Negative.   Skin: Negative.        History of seasonal skin allergies, itching   Neurological: Negative for  seizures.  Endo/Heme/Allergies: Negative.   Psychiatric/Behavioral: Positive for depression and suicidal ideas.  All other systems reviewed and are negative.   Blood pressure 104/71, pulse 73, temperature 98.2 F (36.8 C), temperature source Oral, resp. rate 16, height 5' 3"  (1.6 m), weight 209 lb (94.802 kg), last menstrual period 08/06/2015, SpO2 100 %.Body mass index is 37.03 kg/(m^2).  General Appearance: Fairly Groomed  Engineer, water::  Good  Speech:  Normal Rate  Volume:  Decreased  Mood:  Depressed  Affect:  Constricted, but does smile appropriately at times   Thought Process:  Linear  Orientation:  Full (Time, Place, and Person)  Thought Content:  denies hallucinations, no delusions, not internally preoccupied   Suicidal Thoughts:  Yes.  without intent/plan at this time denies any suicidal plan or intention and contracts for safety on the unit   Homicidal Thoughts:  No- states " I would like to punch him" referring to the man she has been involved with, but denies homicidal ideations  Memory:  recent and remote grossly intact   Judgement:  Fair  Insight:  Fair  Psychomotor Activity:  Decreased  Concentration:  Good  Recall:  Good  Fund of Knowledge:Good  Language: Good  Akathisia:   Negative  Handed:  Right  AIMS (if indicated):     Assets:  Communication Skills Desire for Improvement Resilience  ADL's:  Intact  Cognition: WNL  Sleep:        Treatment Plan Summary: Daily contact with patient to assess and evaluate symptoms and progress in treatment, Medication management, Plan inpatient admission and medications as below   Observation Level/Precautions:  15 minute checks  Laboratory:  as needed   Psychotherapy:  Milieu,  Groups   Medications:  Patient states that Celexa was helpful in the past, does not remember having side effects Restart CELEXA 20 mgrs QDAY for depression Continue TRAZODONE 50 mgrs QHS PRN Insomnia Continue VISTARIL PRNs for Anxiety as needed   Consultations:  As needed   Discharge Concerns:  -  Estimated LOS: 5 days   Other:     I certify that inpatient services furnished can reasonably be expected to improve the patient's condition.   Bryer Cozzolino 12/7/20164:01 PM

## 2015-08-13 NOTE — ED Notes (Addendum)
Pt reports being upset with a man who she wants to be her boyfriend but he doesn't feel the same way. Also has a 41 yr old daughter who [facility] [place] [facility] and has been in and out of group homes. Pt very tearful. Reports suicidal thoughts.

## 2015-08-13 NOTE — BH Assessment (Addendum)
Tele Assessment Note   Amy Rowe is an 39 y.o. female who called 911 today when she had thoughts of stepping in front Of a train.    Patient discussed a history of depression and PTSD.  She reported that [facility] although she had not seen her recently.  Patient reported a history of psychotropic Medications for sleep, anxiety and depression but stated that it had been 6 months since she had been taking These medications because she had been "feeling better".   Patient reported sadness, crying, anxiety, Panic, sleep difficulty, fatigue, no appetite, anger and stated that for the past two weeks she had difficulty wanting To get out of bed.  She reported thoughts of suicide that were "on and off" but stated that it was different this morning And she had a plan to step out in front of a train so she called 911.   Patient denied any past suicide attempts, inpatient treatment, self harm, hallucinations, homicidal ideations, Aggression, drug or alcohol use.  She reported a long history of involvement with CPS for neglect of her children And stated that her 41 year old daughter is currently in the custody of social services.  She reported growing up With her great grandparents and currently has not supports in her community. Patient is open to inpatient Treatment.  Consulted with NP Maye who recommended inpatient treatment.    Diagnosis: 296.32Major depressive disorder, Recurrent episode, Moderate   Past Medical History:  Past Medical History  Diagnosis Date  . Asthma   . Bronchitis   . Anxiety     Past Surgical History  Procedure Laterality Date  . Cesarean section      Family History: No family history on file.  Social History:  reports that she has never smoked. She does not have any smokeless tobacco history on file. She reports that she drinks alcohol. She reports that she does not use illicit drugs.  Additional  Social History:     CIWA: CIWA-Ar BP: 114/79 mmHg Pulse Rate: 66 COWS:    PATIENT STRENGTHS: (choose at least two) Communication skills Motivation for treatment/growth  Allergies:  Allergies  Allergen Reactions  . Other     ANTIBIOTIC-Name is unknown. States that reaction occurred along time ago that included having an infection after  . Latex Swelling and Rash    Home Medications:  (Not in a hospital admission)  OB/GYN Status:  Patient's last menstrual period was 08/06/2015.  General Assessment Data Location of Assessment: [facility] TTS Assessment: In system Is this a Tele or Face-to-Face Assessment?: Tele Assessment Is this an Initial Assessment or a Re-assessment for this encounter?: Initial Assessment           Risk to self with the past 6 months Is patient at risk for suicide?: Yes Substance abuse history and/or treatment for substance abuse?: No                                                Disposition:     Annetta MawKujawa,Neriah Brott G 08/13/2015 7:28 AM

## 2015-08-13 NOTE — ED Notes (Addendum)
Pt to department via EMS.  Pt tearful.  Reports suicidal thoughts.  Denies attempts or plan.  Pt does report drinking "a couple beers" tonight.
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
## 2015-08-13 NOTE — Clinical Social Work Note (Signed)
CSW called to provide disposition regarding pt recommendation of inpatient.  RN was unavailable and a Message was given for her to call CSW back.  [facility] has a bed for pt 402 bed 2  [facility] BubaRegina Lyah [facility]riage [facility] [phone]
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
## 2015-08-14 LAB — LIPID PANEL
CHOL/HDL RATIO: 3.5 ratio
CHOLESTEROL: 220 mg/dL — AB (ref 0–200)
HDL: 62 mg/dL (ref >40)
LDL Cholesterol: 138 mg/dL — ABNORMAL HIGH (ref 0–99)
TRIGLYCERIDES: 98 mg/dL (ref <150)
VLDL: 20 mg/dL (ref 0–40)
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
## 2015-08-14 NOTE — [facility] Group Notes (Signed)
[facility] Group Therapy 08/14/2015 1:15pm  Type of Therapy: Mental Health Association Presentation  Participation Level: Active  Participation Quality: Attentive  Affect: Appropriate  Cognitive: Oriented  Insight: Developing/Improving  Engagement in Therapy: Engaged  Modes of Intervention: Discussion, Education and Socialization  Summary of Progress/Problems: Mental Health Association (MHA) Speaker came to talk about his personal journey with substance abuse and addiction. The pt processed ways by which to relate to the speaker. MHA speaker provided handouts and educational information pertaining to groups and services offered by the [facility]. Pt was engaged in speaker's presentation and was receptive to resources provided.    Chad CordialLauren Carter, LCSWA 08/14/2015 1:53 PM

## 2015-08-14 NOTE — Plan of Care (Signed)
Problem: Alteration in mood; excessive anxiety as evidenced by: Goal: STG-Pt will report an absence of self-harm thoughts/actions (Patient will report an absence of self-harm thoughts or actions)  Outcome: Progressing Pt has not harmed herself tonight.  She denies thoughts of self-harm, denies SI.  Pt verbally contracts for safety.

## 2015-08-14 NOTE — Progress Notes (Signed)
D: Pt has depressed affect and mood.  She reports her day was "okay" and that her goal was "to feel better about myself, group helped with that."  Pt denies SI, denies hallucinations, denies pain.  When asked if she is having thoughts of hurting others, pt states "the person I was having the problems with."  Pt reports this person is not at the hospital and she verbally contracts for safety at [facility].  Pt has been visible in milieu interacting with peers and staff appropriately.   A: Introduced self to pt.  Met with pt 1:1 and provided support and encouragement.  Actively listened to pt.  PRN medication administered for sleep. R: Pt is compliant with medication.  Pt verbally contracts for safety.  Will continue to monitor and assess.
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
## 2015-08-14 NOTE — Tx Team (Signed)
Interdisciplinary Treatment Plan Update (Adult) Date: 08/14/2015   Date: 08/14/2015 1:53 PM  Progress in Treatment:  Attending groups: Yes  Participating in groups: Yes, minimally Taking medication as prescribed: Yes  Tolerating medication: Yes  Family/Significant othe contact made: No, CSW assessing for appropriate contact Patient understands diagnosis: Yes Discussing patient identified problems/goals with staff: Yes  Medical problems stabilized or resolved: Yes  Denies suicidal/homicidal ideation: No, Pt recently admitted with SI Patient has not harmed self or Others: Yes   New problem(s) identified: None identified at this time.   Discharge Plan or Barriers: CSW will assess for appropriate discharge plan and relevant barriers.   Additional comments:  Patient and CSW reviewed pt's identified goals and treatment plan. Patient verbalized understanding and agreed to treatment plan. CSW reviewed [facility] "Discharge Process and Patient Involvement" Form. Pt verbalized understanding of information provided and signed form.   Reason for Continuation of Hospitalization:  Anxiety Depression Medication stabilization Suicidal ideation  Estimated length of stay: 3-5 days  Review of initial/current patient goals per problem list:   1.  Goal(s): Patient will participate in aftercare plan  Met:  No  Target date: 3-5 days from date of admission   As evidenced by: Patient will participate within aftercare plan AEB aftercare provider and housing plan at discharge being identified.  08/14/15: CSW to work with Pt to assess for appropriate discharge plan and faciliate appointments and referrals as needed prior to d/c.  2.  Goal (s): Patient will exhibit decreased depressive symptoms and suicidal ideations.  Met:  No  Target date: 3-5 days from date of admission   As evidenced by: Patient will utilize self rating of depression at 3 or below and demonstrate decreased signs of depression or be  deemed stable for discharge by MD. 08/14/15: Pt was admitted with symptoms of depression, rating 10/10. Pt continues to present with flat affect and depressive symptoms.  Pt will demonstrate decreased symptoms of depression and rate depression at 3/10 or lower prior to discharge.  3.  Goal(s): Patient will demonstrate decreased signs and symptoms of anxiety.  Met:  No  Target date: 3-5 days from date of admission   As evidenced by: Patient will utilize self rating of anxiety at 3 or below and demonstrated decreased signs of anxiety, or be deemed stable for discharge by MD 08/14/15: Pt was admitted with increased levels of anxiety and is currently rating those symptoms highly. Pt will demonstrated decreased symptoms of anxiety and rate it at 3/10 prior to d/c.  Attendees:  Patient:    Family:    Physician: Dr. Parke Poisson, MD  08/14/2015 1:53 PM  Nursing: Lars Pinks, RN Case manager  08/14/2015 1:53 PM  Clinical Social Worker Peri Maris, [place] 08/14/2015 1:53 PM  Other:  08/14/2015 1:53 PM  Clinical: Loletta Specter, RN 08/14/2015 1:53 PM  Other: , RN Charge Nurse 08/14/2015 1:53 PM  Other: Hilda Lias, [place], [place] Social Work [phone]

## 2015-08-14 NOTE — B[facility] Group Notes (Signed)
[facility] Group Notes:  (Nursing/MHT/Case Management/Adjunct)  Date:  08/14/2015  Time:  0900  Type of Therapy:  Nurse Education  - Healthy Lifestyle Changes  Participation Level:  Active  Participation Quality:  Attentive  Affect:  Blunted  Cognitive:  Alert  Insight:  Improving  Engagement in Group:  Engaged  Modes of Intervention:  Discussion, Education and Support  Summary of Progress/Problems: Patient attended group and shared that all of the negative messages that she's received over time "just added up." Open to suggestions about coping skills and positive lifestyle choices.  Merian CapronFriedman, [place] God[facility]akes 08/14/2015, 0930

## 2015-08-14 NOTE — BHH Suicide Risk Assessment (Signed)
B[facility] INPATIENT:  Family/Significant Other Suicide Prevention Education  Suicide Prevention Education:  Patient Refusal for Family/Significant Other Suicide Prevention Education: The patient Amy Rowe has refused to provide written consent for family/significant other to be provided Family/Significant Other Suicide Prevention Education during admission and/or prior to discharge.  Physician notified. SPE reviewed with patient and brochure provided. Patient encouraged to return to hospital if having suicidal thoughts, patient verbalized his/her understanding and has no further questions at this time.   [name], Adolfo Granieri M 08/14/2015, 4:35 PM
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
## 2015-08-15 DIAGNOSIS — F339 Major depressive disorder, recurrent, unspecified: Secondary | ICD-10-CM

## 2015-08-15 LAB — HEMOGLOBIN A1C
HEMOGLOBIN A1C: 6.1 % — AB (ref 4.8–5.6)
MEAN PLASMA GLUCOSE: 128 mg/dL

## 2015-08-15 NOTE — Progress Notes (Signed)
D.  Pt pleasant on approach, denies complaints at this time.  Positive for evening wrap up group, interacting appropriately with peers on the unit.  Denies SI/HI/halluincations at this time.  A.  Support and encouragement offered  R.  Pt remains safe on the unit, will continue to monitor.

## 2015-08-15 NOTE — [facility] Group Notes (Signed)
[facility] LCSW Group Therapy 08/15/2015 1:15pm  Type of Therapy: Group Therapy- Feelings Around Relapse and Recovery  Participation Level: Active   Participation Quality:  Appropriate  Affect:  Appropriate  Cognitive: Alert and Oriented   Insight:  Developing   Engagement in Therapy: Developing/Improving and Engaged   Modes of Intervention: Clarification, Confrontation, Discussion, Education, Exploration, Limit-setting, Orientation, Problem-solving, Rapport Building, Dance movement psychotherapisteality Testing, Socialization and Support  Summary of Progress/Problems: The topic for today was feelings about relapse. The group discussed what relapse prevention is to them and identified triggers that they are on the path to relapse. Members also processed their feeling towards relapse and were able to relate to common experiences. Group also discussed coping skills that can be used for relapse prevention.  Pt arrived to group late but was encouraging to other peers, suggesting that preparation could be a preventative factor against relapse as well as defining which skills personally work best in their recovery.   Therapeutic Modalities:   Cognitive Behavioral Therapy Solution-Focused Therapy Assertiveness Training Relapse Prevention Therapy    Amy SprinklesLauren Rowe, LCSWA [phone] 08/15/2015 4:58 PM

## 2015-08-15 NOTE — [facility] Group Notes (Signed)
[facility]  08/15/2015 8:45 AM  Participation Quality: Alert, Appropriate and Oriented  Mood/Affect: Appropriate  Depression Rating: 3  Anxiety Rating: 5  Thoughts of Suicide: Pt denies SI/HI  Will you contract for safety? Yes  Current AVH: Pt denies  Plan for Discharge/Comments: Pt attended discharge planning group and actively participated in group. CSW discussed suicide prevention education with the group and encouraged them to discuss discharge planning and any relevant barriers. Pt reports that her anxiety is related to returning home. She reports no needs at this time.  Transportation Means: Pt reports access to transportation  Supports: No supports mentioned at this time  Chad CordialLauren Carter, LCSWA 08/15/2015 10:01 AM
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
## 2015-08-15 NOTE — Progress Notes (Signed)
Adult Psychoeducational Group Note  Date:  08/15/2015 Time:  08:00pm Group Topic/Focus:  Wrap-Up Group:   The focus of this group is to help patients review their daily goal of treatment and discuss progress on daily workbooks.  Participation Level:  Active  Participation Quality:  Appropriate and Attentive  Affect:  Appropriate  Cognitive:  Alert and Appropriate  Insight: Appropriate  Engagement in Group:  Engaged  Modes of Intervention:  Discussion  Additional Comments:  Pt was attentive and appropriate during tonight's group discussion. Pt stated that she is working on socializing more and being around people and getting back to normal self and staying healthy. Pt stated that she is working on having positive support group.   Bing PlumeScott, Sophiea Ueda D 08/15/2015, 11:17 PM

## 2015-08-15 NOTE — Progress Notes (Signed)
NSG shift assessment. 7a-7p.   D: Affect blunted, mood depressed, behavior appropriate. Pt reports that her sleep was good, and she did request sleep medication, which helped. She rates her depression as 4/10 sith 10 being the worst; feelings of hopelessness are 2/10 with 10 being the worst; anxiety is 5/10 with 10 being the worst. Her goal today is to be more positive, and she hopes to meet that goal by being active in groups. She wants to tlank all of the staff for working with her.  Attends groups and participates. Cooperative with staff and is getting along well with peers.   A: Observed pt interacting in group and in the milieu: Support and encouragement offered. Safety maintained with observations every 15 minutes.   R:   Contracts for safety and continues to follow the treatment plan, working on learning new coping skills.
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
## 2015-08-16 NOTE — Progress Notes (Signed)
D.  Pt pleasant on approach, denies complaints at this time.  Positive for evening wrap up group, interacting appropriately with peers on the unit.  Denies SI/HI/hallucinations at this time.  A.  Support and encouragement offered  R.  Pt remains safe on the unit, will continue to monitor.  

## 2015-08-16 NOTE — Progress Notes (Signed)
[facility] Group Notes:  (Nursing/MHT/Case Management/Adjunct)  Date:  08/16/2015  Time:  10:11 PM  Type of Therapy:  Psychoeducational Skills  Participation Level:  Active  Participation Quality:  Appropriate  Affect:  Appropriate  Cognitive:  Appropriate  Insight:  Good  Engagement in Group:  Engaged  Modes of Intervention:  Education  Summary of Progress/Problems: The patient verbalized in group that she had a good day overall and that she was able to socialize more than yesterday. As a theme for the day, her coping skill will be to "stay calm" and "walk away" from bad situations.   Hazle CocaGOODMAN, [employer] S 08/16/2015, 10:11 PM
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
## 2015-08-16 NOTE — [facility] Group Notes (Signed)
Late note for 12//10/16  [facility]:  (Clinical Social Work)  08/17/2015     1:15-2:15PM  Summary of Progress/Problems:   The main focus of today's process group was to learn how to use a decisional balance exercise to move forward in the Stages of Change, which were described and discussed.  Patients listed needs on the whiteboard and unhealthy coping techniques often used to fill needs.  Motivational Interviewing and the whiteboard were utilized to help patients explore in depth the perceived benefits and costs of unhealthy coping techniques, as well as the  benefits and costs of replacing that with a healthy coping skills.  A handout was distributed for patients to be able to do this exercise for themselves.   The patient expressed herself very well throughout group, was one of the more interactive group members.  Type of Therapy:  Group Therapy - Process   Participation Level:  Active  Participation Quality:  Attentive, Sharing and Supportive  Affect:  Appropriate  Cognitive:  Appropriate  Insight:  Engaged  Engagement in Therapy:  Engaged  Modes of Intervention:  Education, Motivational Interviewing  Ambrose MantleMareida Grossman-Orr, LCSW 08/17/2015, 8:18 AM

## 2015-08-17 DIAGNOSIS — F332 Major depressive disorder, recurrent severe without psychotic features: Secondary | ICD-10-CM | POA: Insufficient documentation

## 2015-08-17 MED ORDER — LORAZEPAM 1 MG PO TABS
1.0000 mg | ORAL_TABLET | Freq: Once | ORAL | Status: AC
  Administered 2015-08-17: 1 mg via ORAL
  Filled 2015-08-17: qty 1

## 2015-08-17 NOTE — [facility] Group Notes (Signed)
[facility] Group Notes: (Clinical Social Work)   08/17/2015      Type of Therapy:  Group Therapy   Participation Level:  Did Not Attend despite MHT prompting   Meldon Hanzlik Grossman-Orr, LCSW 08/17/2015, 4:39 PM     

## 2015-08-17 NOTE — Progress Notes (Signed)
[facility] Group Notes:  (Nursing/MHT/Case Management/Adjunct)  Date:  08/17/2015  Time:  11:22 PM  Type of Therapy:  Psychoeducational Skills  Participation Level:  Minimal  Participation Quality:  Attentive  Affect:  Flat  Cognitive:  Lacking  Insight:  Lacking  Engagement in Group:  Limited  Modes of Intervention:  Education  Summary of Progress/Problems: The patient mentioned in group that she spent her day dealing with breathing issues. No additional details were provided. In terms of the theme for the day, her support system will consist of her therapist and daughter.   Hazle CocaGOODMAN, Ruthy Forry S 08/17/2015, 11:22 PM

## 2015-08-17 NOTE — Progress Notes (Signed)
Amy Rowe has been visible on the unit.  Attending groups.  Interacting with peers and staff.  She denies SI/HI or A/V hallucinations.  She voiced that she was having chest pain to NP.  EKG completed and heart rate was low at 51.  Notified Tanika NP about the above.  Given ordered ativan and monitor.  She came back from lunch and laid down.  Repeat EKG heart rate was normal.  She denies any chest pain and is resting quietly in her room.  She completed her self inventory and reports that her depression/anxiety are 1/10 and hopelessness 0/10.  She states that her goal for today is to "stay positive" and she plans on achieving that goal by "participation in group."  Encouraged continued participation in group and unit activities.  Q 15 minute checks maintained for safety.  We will continue to monitor the progress towards her goals.

## 2015-08-17 NOTE — [facility] Group Notes (Signed)
[facility] Group Notes:  (Nursing/MHT/Case Management/Adjunct)  Date:  08/17/2015  Time:  1:59 PM  Type of Therapy:  Nurse Education  Participation Level:  Active  Participation Quality:  Appropriate and Attentive  Affect:  Anxious and Appropriate  Cognitive:  Alert and Appropriate  Insight:  Appropriate and Good  Engagement in Group:  Developing/Improving and Engaged  Modes of Intervention:  Discussion and Education  Summary of Progress/Problems:  Group topic was Health Support systems.  Discussed daily goals and what is our favorite holiday.  Amy Rowe stated that she really doesn't have a favorite holiday and likes spring/fall as her favorite time per year.    [name] 08/17/2015, 1:59 PM
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
## 2015-08-17 NOTE — Progress Notes (Signed)
D.  Pt pleasant on approach, no complaints voiced.  Pt was positive for evening wrap up group, interacting appropriately with peers on the unit . Denies SI/HI/hallucinations at this time.  Pt did have an episode of bradycardia today (see EKG on chart), but tonight was within normal limits.  A.  Support and encouragement offered  R.  Pt remains safe on the unit, will continue to monitor.
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
## 2015-08-18 NOTE — [facility] Group Notes (Signed)
[facility] LCSW Group Therapy  08/18/2015 1:15pm  Type of Therapy:  Group Therapy vercoming Obstacles  Participation Level:  Minimal  Participation Quality:  Reserved  Affect:  Appropriate  Cognitive:  Appropriate and Oriented  Insight:  Developing/Improving and Improving  Engagement in Therapy:  Improving  Modes of Intervention:  Discussion, Exploration, Problem-solving and Support  Description of Group:   In this group patients will be encouraged to explore what they see as obstacles to their own wellness and recovery. They will be guided to discuss their thoughts, feelings, and behaviors related to these obstacles. The group will process together ways to cope with barriers, with attention given to specific choices patients can make. Each patient will be challenged to identify changes they are motivated to make in order to overcome their obstacles. This group will be process-oriented, with patients participating in exploration of their own experiences as well as giving and receiving support and challenge from other group members.  Summary of Patient Progress: Pt participated minimally in group discussion. When she does participate, comments are vague and somewhat hard to follow. Pt expressed feeling lonely and reports this as an obstacle.   Therapeutic Modalities:   Cognitive Behavioral Therapy Solution Focused Therapy Motivational Interviewing Relapse Prevention Therapy   Chad CordialLauren Carter, LCSWA 08/18/2015 3:54 PM

## 2015-08-18 NOTE — Progress Notes (Signed)
Adult Psychoeducational Group Note  Date:  08/18/2015 Time:  8:52 PM  Group Topic/Focus:  Wrap-Up Group:   The focus of this group is to help patients review their daily goal of treatment and discuss progress on daily workbooks.  Participation Level:  Active  Participation Quality:  Attentive  Affect:  Appropriate  Cognitive:  Appropriate  Insight: Appropriate and Good  Engagement in Group:  Engaged  Modes of Intervention:  Education  Additional Comments:  Pt overall had a good day. Pt met a new friend who has been positive to her. Pt goal for tomorrow is to stay positive and keep coping skills going.   Amy Rowe 08/18/2015, 8:52 PM

## 2015-08-18 NOTE — Progress Notes (Signed)
Patient ID: Amy Rowe, female   DOB: [birth year], 39 y.o.   MRN: [id number] [facility] MD Progress Note  08/18/2015 1:16 PM DHAMAR GREGORY  MRN:  [id number]   Subjective:  Patient reports partial but significant improvement. States her mood is better. Still feels anxious. Denies medication side effects.  As she improves she is starting to focus more on discharge, and plans to return home . At this time expressing anxiety about " how am I going to get back home after discharge, I don't have transportation.  Objective:  I have discussed case with treatment team and have met with patient. Patient partially but significantly improved compared to admission. In particular reports decreased depression, and seems less ruminative about relationship stressors that had contributed to her decompensation.  States she is feeling better able to set limits with the man she has been in a relationship with, and realizing that she has to " focus more on myself and my own well being ". Does report some ongoing anxiety. Yesterday had episode of increased anxiety, described as panic attack, now fully resolved. At this time presents calm, in no acute distress. Denies medication side effects. Visible on unit, behavior on unit in good control.   Principal Problem: Major depressive disorder, recurrent episode ([place]) Diagnosis:   Patient Active Problem List   Diagnosis Date Noted  . Severe episode of recurrent major depressive disorder, without psychotic features ([place]) [F33.2]   . MDD (major depressive disorder) ([place]) [F32.9] 08/13/2015  . Major depressive disorder, recurrent episode ([place]) [F33.9] 08/13/2015    Duration - 25 minutes   Past Psychiatric History: see admission H and p  Past Medical History:  Past Medical History  Diagnosis Date  . Asthma   . Bronchitis   . Anxiety     Past Surgical History  Procedure Laterality Date  . Cesarean section     Family History: History reviewed. No pertinent  family history. Family Psychiatric  History: see Admission H and P Social History:  History  Alcohol Use  . Yes    Comment: socially     History  Drug Use No    Social History   Social History  . Marital Status: Divorced    Spouse Name: N/A  . Number of Children: N/A  . Years of Education: N/A   Social History Main Topics  . Smoking status: Never Smoker   . Smokeless tobacco: None  . Alcohol Use: Yes     Comment: socially  . Drug Use: No  . Sexual Activity: Not Asked   Other Topics Concern  . None   Social History Narrative   Additional Social History:    Pain Medications: denies Prescriptions: denies Over the Counter: denies History of alcohol / drug use?: No history of alcohol / drug abuse Longest period of sobriety (when/how long): na  Sleep:  Improved   Appetite:   Improved   Current Medications: Current Facility-Administered Medications  Medication Dose Route Frequency Provider Last Rate Last Dose  . acetaminophen (TYLENOL) tablet 650 mg  650 mg Oral Q6H PRN Kerrie Buffalo, NP      . alum & mag hydroxide-simeth (MAALOX/MYLANTA) 200-200-20 MG/5ML suspension 30 mL  30 mL Oral Q4H PRN Kerrie Buffalo, NP      . citalopram (CELEXA) tablet 20 mg  20 mg Oral Daily Jenne Campus, MD   20 mg at 08/18/15 4315  . hydrOXYzine (ATARAX/VISTARIL) tablet 25 mg  25 mg Oral TID PRN Kerrie Buffalo, NP  25 mg at 08/17/15 2226  . ibuprofen (ADVIL,MOTRIN) tablet 200 mg  200 mg Oral Q6H PRN Kerrie Buffalo, NP      . loratadine (CLARITIN) tablet 10 mg  10 mg Oral Daily Encarnacion Slates, NP   10 mg at 08/18/15 0806  . magnesium hydroxide (MILK OF MAGNESIA) suspension 30 mL  30 mL Oral Daily PRN Kerrie Buffalo, NP      . traZODone (DESYREL) tablet 50 mg  50 mg Oral QHS PRN Jenne Campus, MD   50 mg at 08/17/15 2226    Lab Results:  No results found for this or any previous visit (from the past 42 hour(s)).  Physical Findings: AIMS: Facial and Oral Movements Muscles of  Facial Expression: None, normal Lips and Perioral Area: None, normal Jaw: None, normal Tongue: None, normal,Extremity Movements Upper (arms, wrists, hands, fingers): None, normal Lower (legs, knees, ankles, toes): None, normal, Trunk Movements Neck, shoulders, hips: None, normal, Overall Severity Severity of abnormal movements (highest score from questions above): None, normal Incapacitation due to abnormal movements: None, normal Patient's awareness of abnormal movements (rate only patient's report): No Awareness, Dental Status Current problems with teeth and/or dentures?: No Does patient usually wear dentures?: No  CIWA:    COWS:     Musculoskeletal: Strength & Muscle Tone: within normal limits Gait & Station: normal Patient leans: normal  Psychiatric Specialty Exam: Review of Systems  Eyes: Negative.   Respiratory: Negative.   Cardiovascular: Positive for chest pain. Palpitations: EKG orderd.  Gastrointestinal: Negative.   Genitourinary: Negative.   Musculoskeletal: Negative.   Skin: Negative.   Neurological: Negative.   Endo/Heme/Allergies: Negative.   Psychiatric/Behavioral: Positive for depression. The patient is nervous/anxious and has insomnia.   All other systems reviewed and are negative. at this time denies headache, denies chest pain, denies shortness of breath.  Blood pressure 99/73, pulse 85, temperature 98.1 F (36.7 C), temperature source Oral, resp. rate 16, height 5' 3"  (1.6 m), weight 209 lb (94.802 kg), last menstrual period 08/06/2015, SpO2 100 %.Body mass index is 37.03 kg/(m^2).  General Appearance: Casual  Eye Contact::  Good  Speech:  Clear and Coherent and Normal Rate  Volume:  Normal  Mood:   Less depressed, reports mood improved   Affect:   More reactive, less constricted, still anxious   Thought Process:  Coherent and Goal Directed  Orientation:  Full (Time, Place, and Person)  Thought Content:   No hallucinations, no delusions   Suicidal  Thoughts:  No denies suicidal ideations, denies any self injurious ideations  Homicidal Thoughts:  No- denies any thoughts of hurting self or anyone else   Memory:   Recent and remote grossly intact   Judgement:  Improving   Insight:   Fair   Psychomotor Activity:  Normal  Concentration:  Good  Recall:  Good  Fund of Knowledge:Good  Language: Good  Akathisia:  No  Handed:  Right  AIMS (if indicated):     Assets:  Desire for Improvement Housing  ADL's:  Intact  Cognition: WNL  Sleep:  Number of Hours: 6   Assessment - patient's mood is partially but significantly improved. No suicidal ideations. Seems less focused about and less ruminative about relationship issues .  Feeling better about self and about her ability to address stressors. Tolerating medications well. Reports some persistent anxiety , and describes possible panic attack episode yesterday. At this time calm, in no distress.    Treatment Plan Summary:  Daily contact with patient  to assess and evaluate symptoms and progress in treatment and Medication management Continue to encourage group, milieu participation to work on coping skills and symptom reduction Depression; continue Celexa 20 mg  QDAY  Insomnia; continue Trazodone 50 mg HS Treatment Team working on disposition planning options .  Neita Garnet  MD  08/18/2015, 1:16 PM

## 2015-08-18 NOTE — [facility] Group Notes (Signed)
[facility] Note  08/18/2015 8:45 AM  Participation Quality: Alert, Appropriate and Oriented  Mood/Affect: Appropriate  Depression Rating: 0  Anxiety Rating: "high"  Thoughts of Suicide: Pt denies SI/HI  Will you contract for safety? Yes  Current AVH: Pt denies  Plan for Discharge/Comments: Pt attended discharge planning group and actively participated in group. CSW discussed suicide prevention education with the group and encouraged them to discuss discharge planning and any relevant barriers. Pt reports feeling better today but expresses that she is still anxious related to returning to her living situation.  Transportation Means: Pt reports access to transportation  Supports: No supports mentioned at this time  Chad CordialLauren Carter, LCSWA 08/18/2015 9:45 AM

## 2015-08-18 NOTE — Plan of Care (Signed)
Problem: Diagnosis: Increased Risk For Suicide Attempt Goal: LTG-Patient Will Report Improved Mood and Deny Suicidal LTG (by discharge) Patient will report improved mood and deny suicidal ideation.  Outcome: Progressing Pt denies suicidal ideation, appears bright and social.

## 2015-08-19 MED ORDER — CITALOPRAM HYDROBROMIDE 20 MG PO TABS: 20.0000 mg | ORAL_TABLET | Freq: Every day | ORAL | Status: DC

## 2015-08-19 MED ORDER — HYDROXYZINE HCL 25 MG PO TABS: 25.0000 mg | ORAL_TABLET | Freq: Three times a day (TID) | ORAL | Status: DC | PRN

## 2015-08-19 MED ORDER — TRAZODONE HCL 50 MG PO TABS: 50.0000 mg | ORAL_TABLET | Freq: Every evening | ORAL | Status: DC | PRN

## 2015-08-19 NOTE — Progress Notes (Signed)
Recreation Therapy Notes  Animal-Assisted Activity (AAA) Program Checklist/Progress Notes Patient Eligibility Criteria Checklist & Daily Group note for Rec Tx Intervention  Date: 12.13.2016  Time: 2:45pm [facility] [address] [facility]    AAA/T Program Assumption of Risk Form signed by Patient/ or Parent Legal Guardian yes  Patient is free of allergies or sever asthma yes  Patient reports no fear of animals yes  Patient reports no history of cruelty to animals yes  Patient understands his/her participation is voluntary yes  Patient washes hands before animal contact yes  Patient washes hands after animal contact yes  Behavioral Response: Appropriate   Education: Hand Washing, Appropriate Animal Interaction   Education Outcome: Acknowledges education.   Clinical Observations/Feedback: Patient engaged appropriately in pet therapy session, interacting with therapy dog and peers appropriately during session.   Marykay Lexenise L Fonnie Crookshanks, LRT/CTRS        Jearl KlinefelterBlanchfield, Quency Tober L 08/19/2015 3:23 PM

## 2015-08-19 NOTE — Discharge Summary (Signed)
Physician Discharge Summary Note  Patient:  Amy Rowe is an 39 y.o., female MRN:  [id number] DOB:  1975-09-09 Patient phone:  [phone] (home)  [place]   [address] [place] [address],  Total Time spent with patient: 45 minutes  Date of Admission:  08/13/2015 Date of Discharge: 08/19/2015  Reason for Admission:   History of Present Illness:: Patient is a 39 year old female. States " I have been dealing with a lot of stuff, and I have been feeling very rough and overwhelmed over the last couple of weeks". States she is involved in a long term relationship with a man, and that he tends to be hyper critical, with a tendency to put her down, belittle her. Also, she feels he has been cold and distant lately, avoiding her. States she recently found out that he " is interested in someone else". Another stressor is that an adult daughter, who lives in [place], is currently hospitalized in a psychiatric unit. Patient states " she has Schizophrenia". States she has been feeling depressed . States she developed suicidal ideations, mostly passive thoughts, but also developed ideations of walking in front of a train. Patient states " I got home and I was hysterical, crying , and my daughter got concerned and called a crisis hotline" so that patient was brought to the hospital. Patient states she rarely drinks, but does state she had been drinking prior to admission. Admission BAL 70.   Principal Problem: Major depressive disorder, recurrent episode [facility](HCC) Discharge Diagnoses: Patient Active Problem List   Diagnosis Date Noted  . Severe episode of recurrent major depressive disorder, without psychotic features (HCC) [F33.2]   . MDD (major depressive disorder) (HCC) [F32.9] 08/13/2015  . Major depressive disorder, recurrent episode (HCC) [F33.9] 08/13/2015    Past Psychiatric History: See H&P  Past Medical History:  Past Medical History  Diagnosis Date  . Asthma   . Bronchitis   .  Anxiety     Past Surgical History  Procedure Laterality Date  . Cesarean section     Family History: History reviewed. No pertinent family history. Family Psychiatric  History: See H&P Social History:  History  Alcohol Use  . Yes    Comment: socially     History  Drug Use No    Social History   Social History  . Marital Status: Divorced    Spouse Name: N/A  . Number of Children: N/A  . Years of Education: N/A   Social History Main Topics  . Smoking status: Never Smoker   . Smokeless tobacco: None  . Alcohol Use: Yes     Comment: socially  . Drug Use: No  . Sexual Activity: Not Asked   Other Topics Concern  . None   Social History Narrative    Hospital Course:   Amy Rowe was admitted for Major depressive disorder, recurrent episode (HCC), and crisis management.  Pt was treated discharged with the medications listed below under Medication List.  Medical problems were identified and treated as needed.  Home medications were restarted as appropriate.  Improvement was monitored by observation and Amy Rowe 's daily report of symptom reduction.  Emotional and mental status was monitored by daily self-inventory reports completed by Amy Rowe and clinical staff.         Amy Rowe was evaluated by the treatment team for stability and plans for continued recovery upon discharge. Amy Danceamika R Engelbrecht 's motivation was an integral factor for  scheduling further treatment. Employment, transportation, bed availability, health status, family support, and any pending legal issues were also considered during hospital stay. Pt was offered further treatment options upon discharge including but not limited to Residential, Intensive Outpatient, and Outpatient treatment.  SHIREL MALLIS will follow up with the services as listed below under Follow Up Information.     Upon completion of this admission the patient was both mentally and medically stable for discharge  denying suicidal/homicidal ideation, auditory/visual/tactile hallucinations, delusional thoughts and paranoia.    Amy Rowe responded well to treatment with Celexa, Vistaril, and Trazodone without adverse effects. Pt demonstrated improvement without reported or observed adverse effects to the point of stability appropriate for outpatient management. Pertinent labs include: BAL 70, K+ 3.3 (treated, asymptomatic), Cholesterol 220, LDL 138. Reviewed CBC, CMP, BAL, and UDS; all unremarkable aside from noted exceptions.   Physical Findings: AIMS: Facial and Oral Movements Muscles of Facial Expression: None, normal Lips and Perioral Area: None, normal Jaw: None, normal Tongue: None, normal,Extremity Movements Upper (arms, wrists, hands, fingers): None, normal Lower (legs, knees, ankles, toes): None, normal, Trunk Movements Neck, shoulders, hips: None, normal, Overall Severity Severity of abnormal movements (highest score from questions above): None, normal Incapacitation due to abnormal movements: None, normal Patient's awareness of abnormal movements (rate only patient's report): No Awareness, Dental Status Current problems with teeth and/or dentures?: No Does patient usually wear dentures?: No  CIWA:    COWS:     Musculoskeletal: Strength & Muscle Tone: within normal limits Gait & Station: normal Patient leans: N/A  Psychiatric Specialty Exam: Review of Systems  Psychiatric/Behavioral: Positive for depression and substance abuse. Negative for suicidal ideas and hallucinations. The patient is nervous/anxious and has insomnia.   All other systems reviewed and are negative.   Blood pressure 112/68, pulse 92, temperature 98.2 F (36.8 C), temperature source Oral, resp. rate 16, height  (1.6 m), weight 94.802 kg (209 lb), last menstrual period 08/06/2015, SpO2 100 %.Body mass index is 37.03 kg/(m^2).  SEE MD PSE within the SRA    Have you used any form of tobacco in the last 30  days? (Cigarettes, Smokeless Tobacco, Cigars, and/or Pipes): No  Has this patient used any form of tobacco in the last 30 days? (Cigarettes, Smokeless Tobacco, Cigars, and/or Pipes) Yes, No  Metabolic Disorder Labs:  Lab Results  Component Value Date   HGBA1C 6.1* 08/14/2015   MPG 128 08/14/2015   No results found for: PROLACTIN Lab Results  Component Value Date   CHOL 220* 08/14/2015   TRIG 98 08/14/2015   HDL 62 08/14/2015   CHOLHDL 3.5 08/14/2015   VLDL 20 08/14/2015   LDLCALC 138* 08/14/2015    See Psychiatric Specialty Exam and Suicide Risk Assessment completed by Attending Physician prior to discharge.  Discharge destination:  Home  Is patient on multiple antipsychotic therapies at discharge:  No   Has Patient had three or more failed trials of antipsychotic monotherapy by history:  No  Recommended Plan for Multiple Antipsychotic Therapies: NA     Medication List    STOP taking these medications        cetirizine 10 MG tablet  Commonly known as:  ZYRTEC     ibuprofen 200 MG tablet  Commonly known as:  ADVIL,MOTRIN      TAKE these medications      Indication   citalopram 20 MG tablet  Commonly known as:  CELEXA  Take 1 tablet (20 mg total) by mouth daily.  Indication:  Depression     hydrOXYzine 25 MG tablet  Commonly known as:  ATARAX/VISTARIL  Take 1 tablet (25 mg total) by mouth 3 (three) times daily as needed for anxiety.   Indication:  Anxiety Neurosis     traZODone 50 MG tablet  Commonly known as:  DESYREL  Take 1 tablet (50 mg total) by mouth at bedtime as needed for sleep.   Indication:  Trouble Sleeping           Follow-up Information    Follow up with [facility] On 08/21/2015.   Why:  at 9:30am for therapy.   Contact information:   [address] [place], [place] [address] Phone: [phone] Fax: [phone]      Follow up with [facility] On 09/09/2015.   Why:  at 11:00am for medication management.   Contact information:    [address] [place], [place] [address] Phone: [phone] Fax: [phone]      Follow-up recommendations:  Activity:  As tolerated Diet:  Heart healthy with low sodium  Comments:   Take all medications as prescribed. Keep all follow-up appointments as scheduled.  Do not consume alcohol or use illegal drugs while on prescription medications. Report any adverse effects from your medications to your primary care provider promptly.  In the event of recurrent symptoms or worsening symptoms, call 911, a crisis hotline, or go to the nearest emergency department for evaluation.   Signed: Beau Fanny, FNP-BC 08/19/2015, 11:32 AM   Patient seen, Suicide Assessment Completed.  Disposition Plan Reviewed

## 2015-08-19 NOTE — Progress Notes (Signed)
Patient ID: Amy Rowe, female   DOB: 01-Aug-1976, 39 y.o.   MRN: [id number] D: "I know coping skills but I think my family kicks me when I'm down". Pt reports her family have not been very supportive and has called  DSS over to her house many times. Patient in dayroom most of the evening interacting with peers. Pt mood and affect appeared depressed and flat. Pt reports she is tolerating medication well. Pt denies SI/HI/AVH and pain. Pt attended and participated in evening wrap up group. Cooperative with assessment.   A:   Met with pt 1:1. Medications administered as prescribed. Support and encouragement provided to engage in milieu. Pt encouraged to discuss feelings and come to staff with any question or concerns.  R: Patient remains safe and complaint with medications.

## 2015-08-19 NOTE — Progress Notes (Signed)
Patient ID: Amy Rowe, female   DOB: 1976/08/27, 39 y.o.   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 Pt was stable and appreciative at that time. All papers and prescriptions were given and valuables returned. Verbal understanding expressed. Denies SI/HI and A/VH. Pt given opportunity to express concerns and ask questions.

## 2015-08-19 NOTE — Progress Notes (Signed)
Patient ID: Amy Rowe, female   DOB: 01/21/1976, 39 y.o.   MRN: [id number]  Pt currently presents with a pleasant affect and behavior. Per self inventory, pt rates depression at a 0, hopelessness 0 and anxiety 1. Pt's daily goal is "being positive going home" and they intend to do so by "stay motivated." Pt also writes "I enjoyed working with everyone and staff." Pt reports good sleep, a good appetite, normal energy and good concentration. Pt reports that she will be going back to [place] and plans to "stay out of things" and "not let others bother me."   Pt provided with medications per providers orders. Pt's labs and vitals were monitored throughout the day. Pt supported emotionally and encouraged to express concerns and questions. Pt educated on medications and suicide prevention resources.   Pt's safety ensured with 15 minute and environmental checks. Pt currently denies SI/HI and A/V hallucinations. Pt verbally agrees to seek staff if SI/HI or A/VH occurs and to consult with staff before acting on these thoughts. Pt to be discahrged today per MD. Will continue POC.

## 2015-08-19 NOTE — Progress Notes (Signed)
  [facility] Dis[facility] :  Will you be returning to the same living situation after discharge:  Yes,  Pt returning home At discharge, do you have transportation home?: Yes,  [place] to [facility]; Pt has a [facility] to her residence Do you have the ability to pay for your medications: Yes,  Pt provided with prescriptions  Release of information consent forms completed and in the chart;  Patient's signature needed at discharge.  Patient to Follow up at: Follow-up Information    [facility].   Why:  at 9:30am for therapy.   Contact information:   [address] [place], [place] [address] Phone: [phone](336) [phone] Fax: [phone]      [facility] 09/09/2015.   Why:  at 11:00am for medication management.   Contact information:   [address] [place], [place] [address] Phone: [phone](336) [phone] Fax: [phone]      Next level of care provider has [facility] to [facility] Link:no  Patient denies SI/HI: Yes,  Pt denies    Safety Planning and Suicide Prevention discussed: Yes,  with Pt; declined family contact  Have you used any form of tobacco in the last 30 days? (Cigarettes, Smokeless Tobacco, Cigars, and/or Pipes): No  Has patient been referred to the Quitline?: N/A patient is not a smoker  Patient has been referred for addiction treatment: N/A  Elaina HoopsCarter, Cobe Viney M 08/19/2015, 10:20 AM
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
## 2015-08-19 NOTE — Tx Team (Signed)
Interdisciplinary Treatment Plan Update (Adult) Date: 08/19/2015   Date: 08/19/2015 9:07 AM  Progress in Treatment:  Attending groups: Yes  Participating in groups: Yes, minimally Taking medication as prescribed: Yes  Tolerating medication: Yes  Family/Significant othe contact made: No, Pt declines Patient understands diagnosis: Yes Discussing patient identified problems/goals with staff: Yes  Medical problems stabilized or resolved: Yes  Denies suicidal/homicidal ideation: Yes Patient has not harmed self or Others: Yes   New problem(s) identified: None identified at this time.   Discharge Plan or Barriers: CSW will assess for appropriate discharge plan and relevant barriers.   Additional comments:  Patient and CSW reviewed pt's identified goals and treatment plan. Patient verbalized understanding and agreed to treatment plan. CSW reviewed [facility] "Discharge Process and Patient Involvement" Form. Pt verbalized understanding of information provided and signed form.   Reason for Continuation of Hospitalization:  Anxiety Depression Medication stabilization Suicidal ideation  Estimated length of stay: 0 days; Pt stable for DC today  Review of initial/current patient goals per problem list:   1.  Goal(s): Patient will participate in aftercare plan  Met:  Yes  Target date: 3-5 days from date of admission   As evidenced by: Patient will participate within aftercare plan AEB aftercare provider and housing plan at discharge being identified.  08/14/15: CSW to work with Pt to assess for appropriate discharge plan and faciliate appointments and referrals as needed prior to d/c. 08/19/15: Pt will return home and follow-up with [facility]  2.  Goal (s): Patient will exhibit decreased depressive symptoms and suicidal ideations.  Met:  Yes  Target date: 3-5 days from date of admission   As evidenced by: Patient will utilize self rating of depression at 3 or below and demonstrate  decreased signs of depression or be deemed stable for discharge by MD. 08/14/15: Pt was admitted with symptoms of depression, rating 10/10. Pt continues to present with flat affect and depressive symptoms.  Pt will demonstrate decreased symptoms of depression and rate depression at 3/10 or lower prior to discharge. 08/19/15: Pt rates depression at 0/10; denies SI  3.  Goal(s): Patient will demonstrate decreased signs and symptoms of anxiety.  Met:  Adequate for DC  Target date: 3-5 days from date of admission   As evidenced by: Patient will utilize self rating of anxiety at 3 or below and demonstrated decreased signs of anxiety, or be deemed stable for discharge by MD 08/14/15: Pt was admitted with increased levels of anxiety and is currently rating those symptoms highly. Pt will demonstrated decreased symptoms of anxiety and rate it at 3/10 prior to d/c. 08/19/15: Pt still describes feeling anxious about returning home, however, MD feels that Pt's symptoms have decreased to the point that they can be managed in an outpatient setting.  Attendees:  Patient:    Family:    Physician: Dr. Parke Poisson, MD  08/19/2015 9:07 AM  Nursing: Lars Pinks, RN Case manager  08/19/2015 9:07 AM  Clinical Social Worker Peri Maris, [place] 08/19/2015 9:07 AM  Other:  08/19/2015 9:07 AM  Clinical: Marcella Dubs, RN 08/19/2015 9:07 AM  Other: , RN Charge Nurse 08/19/2015 9:07 AM  Other: Hilda Lias, [place], [place] Social Work [phone]

## 2015-08-19 NOTE — Plan of Care (Signed)
Problem: Diagnosis: Increased Risk For Suicide Attempt Goal: STG-Patient Will Comply With Medication Regime Outcome: Progressing Pt complaint with medication regime     

## 2015-08-19 NOTE — Progress Notes (Signed)
Patient ID: Amy Rowe, female   DOB: Aug 24, 1976, 39 y.o.   MRN: [id number]  Adult Psychoeducational Group Note  Date:  08/19/2015 Time: 09:10AM  Group Topic/Focus:  Recovery Goals:   The focus of this group is to identify appropriate goals for recovery and establish a plan to achieve them.  Participation Level:  Active  Participation Quality:  Appropriate  Affect:  Appropriate  Cognitive:  Appropriate  Insight: Appropriate  Engagement in Group:  Engaged  Modes of Intervention:  Activity, Discussion, Education and Support  Additional Comments:  Pt to identify one goal for his personal recovery today.   Aurora Maskwyman, Denelda Akerley E 08/19/2015, 10:30 AM

## 2015-08-19 NOTE — Progress Notes (Signed)
Pt attended spiritual care group on grief and loss facilitated by chaplain Dayanis Bergquist  Group opened with brief discussion and psycho-social ed around grief and loss in relationships and in relation to self - identifying life patterns, circumstances, changes that cause losses. Established group norm of speaking from own life experience. Group goal of establishing open and affirming space for members to share loss and experience with grief, normalize grief experience and provide psycho social education and grief support. Group process drew on brief CBT, narrative and Adlerian models    

## 2018-06-24 ENCOUNTER — Ambulatory Visit (HOSPITAL_COMMUNITY)
Admission: EM | Admit: 2018-06-24 | Discharge: 2018-06-24 | Disposition: A | Discharge status: Home / Self Care | Payer: Self-pay | Attending: Family Medicine | Admitting: Family Medicine

## 2018-06-24 ENCOUNTER — Encounter (HOSPITAL_COMMUNITY): Payer: Self-pay

## 2018-06-24 DIAGNOSIS — R58 Hemorrhage, not elsewhere classified: Secondary | ICD-10-CM

## 2018-06-24 NOTE — Discharge Instructions (Addendum)
The discoloration in your right forearm is coming from a small amount of blood that leaked from the elbow area where the blood was drawn.  The staining of the skin will take a couple weeks to disappear.  You may want to hold off on further donations until this is cleared.

## 2018-06-24 NOTE — ED Triage Notes (Signed)
Pt presents with complaints of donating plasma about a week ago. Has since noticed bruising to her right arm that is tender to touch.
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
## 2018-06-26 ENCOUNTER — Emergency Department (HOSPITAL_COMMUNITY)
Admission: EM | Admit: 2018-06-26 | Discharge: 2018-06-26 | Disposition: A | Discharge status: Home / Self Care | Payer: Self-pay | Attending: Emergency Medicine | Admitting: Emergency Medicine

## 2018-06-26 ENCOUNTER — Encounter (HOSPITAL_COMMUNITY): Payer: Self-pay | Admitting: Emergency Medicine

## 2018-06-26 DIAGNOSIS — J45909 Unspecified asthma, uncomplicated: Secondary | ICD-10-CM | POA: Insufficient documentation

## 2018-06-26 DIAGNOSIS — M79601 Pain in right arm: Secondary | ICD-10-CM | POA: Insufficient documentation

## 2018-06-26 MED ORDER — ACETAMINOPHEN 325 MG PO TABS: 650.0000 mg | ORAL_TABLET | Freq: Four times a day (QID) | ORAL | 0 refills | Status: DC | PRN

## 2018-06-26 NOTE — ED Provider Notes (Signed)
Emergency Department Provider Note   I have reviewed the triage vital signs and the nursing notes.   HISTORY  Chief Complaint Arm Pain   HPI Amy Rowe is a 42 y.o. female who presents the emergency department today 2 days after being seen in urgent care for the same thing with improving bruising in her right arm.  Patient states that she gave plasma a couple weeks ago 3 or 4 days later she started having some random ecchymosis areas on her arm.  It is progressively worsened she went to urgent care couple days ago and is still not get better so she presents here.  She has no bruising elsewhere.  She does have easy bruising.  She has no personal or family history of hemophilia.  No evidence of trauma in that area.  States that most of bruising is improved but some of it still there and it hurts. No other associated or modifying symptoms.    Past Medical History:  Diagnosis Date  . Anxiety   . Asthma   . Bronchitis     Patient Active Problem List   Diagnosis Date Noted  . Severe episode of recurrent major depressive disorder, without psychotic features (HCC)   . MDD (major depressive disorder) 08/13/2015  . Major depressive disorder, recurrent episode (HCC) 08/13/2015    Past Surgical History:  Procedure Laterality Date  . CESAREAN SECTION      Current Outpatient Rx  . Order #: [id number] Class: Print  . Order #: [id number] Class: Print  . Order #: [id number] Class: Print  . Order #: [id number] Class: Print    Allergies Other and Latex  History reviewed. No pertinent family history.  Social History Social History   Tobacco Use  . Smoking status: Never Smoker  Substance Use Topics  . Alcohol use: Yes    Comment: socially  . Drug use: No    Review of Systems  All other systems negative except as documented in the HPI. All pertinent positives and negatives as reviewed in the HPI. ____________________________________________   PHYSICAL EXAM:  VITAL  SIGNS: ED Triage Vitals [06/26/18 1425]  Enc Vitals Group     BP 122/76     Pulse Rate 66     Resp 18     Temp 98.1 F (36.7 C)     Temp Source Oral     SpO2 100 %     Weight 191 lb (86.6 kg)     Height 5\' 3"  (1.6 m)     Head Circumference      Peak Flow      Pain Score 5     Pain Loc      Pain Edu?      Excl. in GC?     Constitutional: Alert and oriented. Well appearing and in no acute distress. Eyes: Conjunctivae are normal. PERRL. EOMI. Head: Atraumatic. Nose: No congestion/rhinnorhea. Mouth/Throat: Mucous membranes are moist.  Oropharynx non-erythematous. Neck: No stridor.  No meningeal signs.   Cardiovascular: Normal rate, regular rhythm. Good peripheral circulation. Grossly normal heart sounds.   Respiratory: Normal respiratory effort.  No retractions. Lungs CTAB. Gastrointestinal: Soft and nontender. No distention.  Musculoskeletal: No lower extremity tenderness nor edema. No gross deformities of extremities. Neurologic:  Normal speech and language. No gross focal neurologic deficits are appreciated.  Skin:  Skin is warm, dry and intact. No rash noted.  Ecchymosis noted to multiple areas on her right arm.  Some induration on these areas but no obvious compartment syndrome.  No erythema to suggest infection __________________________________________   INITIAL IMPRESSION / ASSESSMENT AND PLAN / ED COURSE  I suspect that she had a injury to her venous structures deep in her arm and likely bled into muscular or other compartments and just came to the surface and the breakdown of the blood products was causing the discomfort.  I do not think this is infectious or a primary clotting disorder.  Work note given.  Pertinent labs & imaging results that were available during my care of the patient were reviewed by me and considered in my medical decision making (see chart for details).  ____________________________________________  FINAL CLINICAL IMPRESSION(S) / ED  DIAGNOSES  Final diagnoses:  Right arm pain     MEDICATIONS GIVEN DURING THIS VISIT:  Medications - No data to display   NEW OUTPATIENT MEDICATIONS STARTED DURING THIS VISIT:  New Prescriptions   ACETAMINOPHEN (TYLENOL) 325 MG TABLET    Take 2 tablets (650 mg total) by mouth every 6 (six) hours as needed.    Note:  This note was prepared with assistance of Dragon voice recognition software. Occasional wrong-word or sound-a-like substitutions may have occurred due to the inherent limitations of voice recognition software.   Marily Memos, MD 06/26/18 [phone]

## 2018-06-26 NOTE — ED Triage Notes (Signed)
Pt reports donating plasma a little over a week ago and has been having bruising to right arm since then.  Went to [facility] on 10/19 and was told it would take some time to resolve but is hurting badly.

## 2018-06-26 NOTE — ED Notes (Signed)
Pt has significant bruising to right arm. Painful when touching. Donated Plasma a few days ago.

## 2020-10-03 ENCOUNTER — Other Ambulatory Visit: Payer: Self-pay

## 2020-10-03 ENCOUNTER — Emergency Department (HOSPITAL_COMMUNITY)
Admission: EM | Admit: 2020-10-03 | Discharge: 2020-10-05 | Disposition: A | Discharge status: Home / Self Care | Payer: Self-pay | Attending: Emergency Medicine | Admitting: Emergency Medicine

## 2020-10-03 ENCOUNTER — Emergency Department (HOSPITAL_COMMUNITY): Payer: Self-pay

## 2020-10-03 ENCOUNTER — Encounter (HOSPITAL_COMMUNITY): Payer: Self-pay | Admitting: *Deleted

## 2020-10-03 DIAGNOSIS — J45909 Unspecified asthma, uncomplicated: Secondary | ICD-10-CM | POA: Insufficient documentation

## 2020-10-03 DIAGNOSIS — U071 COVID-19: Secondary | ICD-10-CM | POA: Insufficient documentation

## 2020-10-03 DIAGNOSIS — R443 Hallucinations, unspecified: Secondary | ICD-10-CM | POA: Insufficient documentation

## 2020-10-03 DIAGNOSIS — F17210 Nicotine dependence, cigarettes, uncomplicated: Secondary | ICD-10-CM | POA: Insufficient documentation

## 2020-10-03 DIAGNOSIS — R45851 Suicidal ideations: Secondary | ICD-10-CM | POA: Insufficient documentation

## 2020-10-03 DIAGNOSIS — Z91040 Latex allergy status: Secondary | ICD-10-CM | POA: Insufficient documentation

## 2020-10-03 HISTORY — DX: Depression, unspecified: F32.A

## 2020-10-03 LAB — CBC WITH DIFFERENTIAL/PLATELET
Abs Immature Granulocytes: 0.02 10*3/uL (ref 0.00–0.07)
Basophils Absolute: 0 10*3/uL (ref 0.0–0.1)
Basophils Relative: 1 %
Eosinophils Absolute: 0.1 10*3/uL (ref 0.0–0.5)
Eosinophils Relative: 2 %
HCT: 44 % (ref 36.0–46.0)
Hemoglobin: 13.9 g/dL (ref 12.0–15.0)
Immature Granulocytes: 0 %
Lymphocytes Relative: 35 %
Lymphs Abs: 2 10*3/uL (ref 0.7–4.0)
MCH: 28.6 pg (ref 26.0–34.0)
MCHC: 31.6 g/dL (ref 30.0–36.0)
MCV: 90.5 fL (ref 80.0–100.0)
Monocytes Absolute: 0.4 10*3/uL (ref 0.1–1.0)
Monocytes Relative: 6 %
Neutro Abs: 3.3 10*3/uL (ref 1.7–7.7)
Neutrophils Relative %: 56 %
Platelets: 240 10*3/uL (ref 150–400)
RBC: 4.86 MIL/uL (ref 3.87–5.11)
RDW: 14.6 % (ref 11.5–15.5)
WBC: 5.8 10*3/uL (ref 4.0–10.5)
nRBC: 0 % (ref 0.0–0.2)

## 2020-10-03 LAB — RAPID URINE DRUG SCREEN, HOSP PERFORMED
Amphetamines: NOT DETECTED
Barbiturates: NOT DETECTED
Benzodiazepines: NOT DETECTED
Cocaine: NOT DETECTED
Opiates: NOT DETECTED
Tetrahydrocannabinol: NOT DETECTED

## 2020-10-03 LAB — BASIC METABOLIC PANEL
Anion gap: 5 (ref 5–15)
BUN: 10 mg/dL (ref 6–20)
CO2: 21 mmol/L — ABNORMAL LOW (ref 22–32)
Calcium: 9.2 mg/dL (ref 8.9–10.3)
Chloride: 106 mmol/L (ref 98–111)
Creatinine, Ser: 0.82 mg/dL (ref 0.44–1.00)
GFR, Estimated: >60 mL/min (ref >60)
Glucose, Bld: 90 mg/dL (ref 70–99)
Potassium: 3.3 mmol/L — ABNORMAL LOW (ref 3.5–5.1)
Sodium: 132 mmol/L — ABNORMAL LOW (ref 135–145)

## 2020-10-03 LAB — ETHANOL: Alcohol, Ethyl (B): <10 mg/dL (ref <10)

## 2020-10-03 LAB — ACETAMINOPHEN LEVEL: Acetaminophen (Tylenol), Serum: <10 ug/mL — ABNORMAL LOW (ref 10–30)

## 2020-10-03 LAB — PREGNANCY, URINE: Preg Test, Ur: NEGATIVE

## 2020-10-03 LAB — SALICYLATE LEVEL: Salicylate Lvl: <7 mg/dL — ABNORMAL LOW (ref 7.0–30.0)

## 2020-10-03 NOTE — BH Assessment (Signed)
Melbourne Abts, PA-C, recommends inpatient treatment. Tad Moore, no female beds available. Disposition SW will seek placement in the AM.

## 2020-10-03 NOTE — ED Provider Notes (Signed)
[facility] EMERGENCY DEPARTMENT Provider Note   CSN: [id number] Arrival date & time: 10/03/20  1648    History Chief Complaint  Patient presents with  . Hallucinations    Amy Rowe is a 45 y.o. female with past medical history significant for SI, depression who resents for evaluation of hallucinations.  Patient states these have been ongoing since June of this year.  States initially they were telling her to "do silly things" however patient states recently they are telling her to try and harm herself.  They are telling her to stop doing things such as stopped eating and to take "a bunch of medications."  She states she does have a very remote history of self-harm "many years ago" however denies any recent history of self harm.  No recent injury or trauma.  No headache, lightheadedness, dizziness, chest pain, shortness of breath, abdominal pain, dysuria.  Triage note does mention "a tingling sensation" to her private area over the last year.  Patient denies any complaints on my exam.  Patient states "I am here because I need help."  States she does occasionally drink alcohol approximate 1 or 2 times a week, 1 or 2 drinks at a time.  Denies any illicit substance use.  Has been hospitalized for depression many years ago however nothing recently.  Patient denies any visual hallucinations.  These are purely auditory.  She is not currently followed by psychiatry.  Was previously under the care of psychiatry however has not been seen recently.  Denies additional aggravating or relieving factors.  Patient does state that the voices are telling her to "take a bunch of medications"  History obtained from patient and past medical records.  No interpreter used.   HPI     Past Medical History:  Diagnosis Date  . Anxiety   . Asthma   . Bronchitis   . Depression     Patient Active Problem List   Diagnosis Date Noted  . Severe episode of recurrent major depressive disorder, without psychotic  features (HCC)   . MDD (major depressive disorder) 08/13/2015  . Major depressive disorder, recurrent episode (HCC) 08/13/2015    Past Surgical History:  Procedure Laterality Date  . CESAREAN SECTION       OB History   No obstetric history on file.     History reviewed. No pertinent family history.  Social History   Tobacco Use  . Smoking status: Current Every Day Smoker    Types: Cigarettes  . Smokeless tobacco: Never Used  Substance Use Topics  . Alcohol use: Yes    Comment: socially  . Drug use: No    Home Medications Prior to Admission medications   Medication Sig Start Date End Date Taking? Authorizing Provider  acetaminophen (TYLENOL) 325 MG tablet Take 2 tablets (650 mg total) by mouth every 6 (six) hours as needed. 06/26/18   Mesner, Barbara Cower, MD  citalopram (CELEXA) 20 MG tablet Take 1 tablet (20 mg total) by mouth daily. 08/19/15   Withrow, Everardo All, FNP  hydrOXYzine (ATARAX/VISTARIL) 25 MG tablet Take 1 tablet (25 mg total) by mouth 3 (three) times daily as needed for anxiety. 08/19/15   Withrow, Everardo All, FNP  traZODone (DESYREL) 50 MG tablet Take 1 tablet (50 mg total) by mouth at bedtime as needed for sleep. 08/19/15   Withrow, Everardo All, FNP    Allergies    Other and Latex  Review of Systems   Review of Systems  Constitutional: Negative.   HENT:  Negative.   Respiratory: Negative.   Cardiovascular: Negative.   Gastrointestinal: Negative.   Genitourinary: Negative.   Musculoskeletal: Negative.   Neurological: Negative.   Psychiatric/Behavioral: Positive for hallucinations and suicidal ideas.  All other systems reviewed and are negative.   Physical Exam Updated Vital Signs BP (!) 158/84 (BP Location: Right Arm)   Pulse 85   Temp 98.5 F (36.9 C) (Oral)   Resp 18   Ht 5\' 3"  (1.6 m)   Wt 90.2 kg   LMP  (LMP Unknown)   SpO2 100%   BMI 35.23 kg/m   Physical Exam Vitals and nursing note reviewed.  Constitutional:      General: She is not in acute  distress.    Appearance: She is well-developed and well-nourished. She is not ill-appearing, toxic-appearing or diaphoretic.  HENT:     Head: Normocephalic and atraumatic.     Nose: Nose normal.  Eyes:     Pupils: Pupils are equal, round, and reactive to light.  Cardiovascular:     Rate and Rhythm: Normal rate.     Pulses: Normal pulses and intact distal pulses.  Pulmonary:     Effort: No respiratory distress.  Abdominal:     General: There is no distension.     Palpations: Abdomen is soft.  Musculoskeletal:        General: Normal range of motion.     Cervical back: Normal range of motion.  Skin:    General: Skin is warm and dry.  Neurological:     General: No focal deficit present.     Mental Status: She is alert and oriented to person, place, and time.     Cranial Nerves: No cranial nerve deficit.  Psychiatric:        Attention and Perception: She perceives auditory hallucinations. She does not perceive visual hallucinations.        Mood and Affect: Mood and affect normal. Affect is flat.        Behavior: Behavior is withdrawn.        Thought Content: Thought content includes suicidal ideation. Thought content includes suicidal plan.     Comments: Flat affect, admits to auditory hallucinations telling her to harm herself.  Appears withdrawn.  Admits to thoughts of suicide ideation with plans to "take a bunch of medications."    ED Results / Procedures / Treatments   Labs (all labs ordered are listed, but only abnormal results are displayed) Labs Reviewed  BASIC METABOLIC PANEL - Abnormal; Notable for the following components:      Result Value   Sodium 132 (*)    Potassium 3.3 (*)    CO2 21 (*)    All other components within normal limits  ACETAMINOPHEN LEVEL - Abnormal; Notable for the following components:   Acetaminophen (Tylenol), Serum <10 (*)    All other components within normal limits  SALICYLATE LEVEL - Abnormal; Notable for the following components:    Salicylate Lvl <7.0 (*)    All other components within normal limits  RESP PANEL BY RT-PCR (FLU A&B, COVID) ARPGX2  ETHANOL  PREGNANCY, URINE  CBC WITH DIFFERENTIAL/PLATELET  RAPID URINE DRUG SCREEN, HOSP PERFORMED    EKG None  Radiology CT Head Wo Contrast  Result Date: 10/03/2020 CLINICAL DATA:  Psychosis EXAM: CT HEAD WITHOUT CONTRAST TECHNIQUE: Contiguous axial images were obtained from the base of the skull through the vertex without intravenous contrast. COMPARISON:  None. FINDINGS: Brain: No acute territorial infarction, hemorrhage or intracranial mass. The ventricles  are nonenlarged. Vascular: No hyperdense vessel or unexpected calcification. Skull: Normal. Negative for fracture or focal lesion. Sinuses/Orbits: No acute finding. Other: None IMPRESSION: Negative non contrasted CT appearance of the brain. Electronically Signed   By: Jasmine Pang M.D.   On: 10/03/2020 18:18    Procedures Procedures   Medications Ordered in ED Medications - No data to display  ED Course  I have reviewed the triage vital signs and the nursing notes.  Pertinent labs & imaging results that were available during my care of the patient were reviewed by me and considered in my medical decision making (see chart for details).  45 year old history of depression presents for evaluation of hallucinations with suicidal thoughts.  Patient states loose Nations x6 months, increasingly worsening.  They are now telling her to harm herself.  Patient with thoughts of suicide ideation with plans to take medication.  She appears otherwise well.  Does appear to have flat affect.  Has been off Celexa x1 year.  Was previously followed by psychiatry however not recently.  Given no history of hallucinations will obtain CT, labs to rule out any medical cause.  Labs and imaging personally reviewed and interpreted:  CBC without leukocytosis CMP mild hyponatremia at 132, potassium 3.3, CO2 21, no additional electrolyte,  renal or liver normality Pregnancy test negative Ethanol <10, Salicylate <7, Acetaminophen <10 UDS negative CT head without acute abnormality   Patient is not currently under IVC, Here voluntarily. If attempts to leave prior to Psych clearing patient will need to be IVC'd    Patient medically cleared. Disposition per Psych. Hold orders placed     MDM Rules/Calculators/A&P                           Final Clinical Impression(s) / ED Diagnoses Final diagnoses:  Hallucinations  Suicidal thoughts    Rx / DC Orders ED Discharge Orders    None       Rajah Tagliaferro A, PA-C 10/03/20 Horris Latino, MD 10/04/20 1840

## 2020-10-03 NOTE — ED Notes (Signed)
Pt wanded in triage by security.  

## 2020-10-03 NOTE — ED Triage Notes (Signed)
Pt states she is hearing voices since middle of June of last year. Pt states that the voices are telling her to kill herself and not to eat . Denies visual hallucinations.  Pt also states she is having tingling sensation to private area since middle of June of last year.  Sensitive to sound per pt.
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
## 2020-10-04 LAB — RESP PANEL BY RT-PCR (FLU A&B, COVID) ARPGX2
Influenza A by PCR: NEGATIVE
Influenza B by PCR: NEGATIVE
SARS Coronavirus 2 by RT PCR: POSITIVE — AB

## 2020-10-04 MED ORDER — OLANZAPINE 5 MG PO TABS: 2.5000 mg | ORAL_TABLET | Freq: Every day | ORAL | Status: DC

## 2020-10-04 MED ORDER — TRAZODONE HCL 50 MG PO TABS
50.0000 mg | ORAL_TABLET | Freq: Every evening | ORAL | Status: DC | PRN
Start: 2020-10-04 — End: 2020-10-05
  Administered 2020-10-04: 50 mg via ORAL
  Filled 2020-10-04: qty 1

## 2020-10-04 MED ORDER — RISPERIDONE 1 MG PO TABS
0.5000 mg | ORAL_TABLET | Freq: Two times a day (BID) | ORAL | Status: DC
  Administered 2020-10-04 – 2020-10-05 (×3): 0.5 mg via ORAL
  Filled 2020-10-04 (×3): qty 1

## 2020-10-04 MED ORDER — HYDROXYZINE HCL 25 MG PO TABS: 25.0000 mg | ORAL_TABLET | Freq: Three times a day (TID) | ORAL | Status: DC | PRN

## 2020-10-04 NOTE — ED Provider Notes (Signed)
  Physical Exam  BP 109/68 (BP Location: Right Arm)   Pulse 60   Temp 98.4 F (36.9 C)   Resp 14   Ht 5\' 3"  (1.6 m)   Wt 90.2 kg   LMP  (LMP Unknown)   SpO2 97%   BMI 35.23 kg/m   Physical Exam  ED Course/Procedures     Procedures  MDM  Patient is Covid positive.  No symptoms.  Unvaccinated.       , MD 10/04/20 1143

## 2020-10-04 NOTE — ED Notes (Signed)
Pt given phone to make phone call.  

## 2020-10-04 NOTE — ED Notes (Signed)
Pt given peanut butter and crackers and sprite.

## 2020-10-04 NOTE — ED Notes (Signed)
CRITICAL VALUE ALERT  Critical Value:  Covid positive  Date & Time Notied:  10/04/20 1139  Provider Notified: Dr. Rubin Payor  Orders Received/Actions taken: EDP notified

## 2020-10-04 NOTE — BH Assessment (Addendum)
TTS completed reassessment on patient .This Probation officer met with patient earlier this date to evaluate current mental health status.  Pt presented in scrubs and was sitting on her bed during interview.  TTS verified pt name and date of birth. Patient is calm and cooperative during interview. Pt was oriented x4 but endorses AH. Patient advised writer that there are" three different voices she hears , two are very aggressive and the other voice just wants to talk". Patient stated" they all are having conversations with each other". Patient denied the voices are telling her to harm herself  or others today but they were yesterday. Patient stated the voices started 05-Mar-2020 after her aunt died and she broke up with her boyfriend. Patient also stated "that it feels like someone is moving with her , circling around her trying to make her move" . Patient stated she is worried about her belongs " her teddy bears and stuff animals that are at her aunts Ellin Saba ) house. Patient stated she is stressed and uncomfortable staying with her aunt and she does not trust her with her teddy bears and stuff animals that are special to her. Patient stated her teddy bears and stuff animals were " anointed" and "prayed over by a Pastor that's why they are special to her.  Patient denies SI/ HI / VH and states she feels more relaxed today . Patient advised Probation officer that she stop taking her medication for depression and anxiety a year after being prescribed because she thought she was better. Patient stated she does not like taking medications because "they are addictive". Pt is still displaying psychotic behaviors. Pt still meets criteria for inpatient treatment.   Disposition : per Merlyn Lot , NP pt still meets criteria for inpatient

## 2020-10-04 NOTE — Care Management (Signed)
Per [facility] there are no appropriate beds at [facility].  Per Dr. Tamera Punt, the patient will be referred out to other facilities:   [facility] Details  Fax        [address], [place] [address]    Internal comment    [facility] Details  Fax        [address] [place] [address]    Internal comment    [facility] Details  Fax        [address] [place] [address]    Internal comment    [facility] Details  Fax        [address] [place] [address]    Internal comment    [facility] Details  Fax        [address] [place], [place] [address]    Internal comment    [facility] Details  Fax        [address] [place] [address]    Internal comment    [facility] Details  Fax        [phone]. [address] [place] [address]    Internal comment    [facility] Details  Fax        [address] [place] [address]    Internal comment    [facility] Details  Fax        [address] [place] [address]    Internal comment    [facility] Details  Fax        [address], [place] [address]    Internal comment    [facility] Details  Fax        [address] [place] [address]    Internal comment

## 2020-10-05 ENCOUNTER — Inpatient Hospital Stay (HOSPITAL_COMMUNITY)
Admission: AD | Admit: 2020-10-05 | Discharge: 2020-10-13 | DRG: 885 | Disposition: A | Discharge status: Home / Self Care | Payer: Federal, State, Local not specified - Other | Source: Intra-hospital | Attending: Psychiatry | Admitting: Psychiatry

## 2020-10-05 ENCOUNTER — Other Ambulatory Visit: Payer: Self-pay

## 2020-10-05 ENCOUNTER — Encounter (HOSPITAL_COMMUNITY): Payer: Self-pay

## 2020-10-05 DIAGNOSIS — Z79899 Other long term (current) drug therapy: Secondary | ICD-10-CM | POA: Diagnosis not present

## 2020-10-05 DIAGNOSIS — E785 Hyperlipidemia, unspecified: Secondary | ICD-10-CM | POA: Diagnosis present

## 2020-10-05 DIAGNOSIS — U071 COVID-19: Secondary | ICD-10-CM | POA: Diagnosis present

## 2020-10-05 DIAGNOSIS — F17210 Nicotine dependence, cigarettes, uncomplicated: Secondary | ICD-10-CM | POA: Diagnosis present

## 2020-10-05 DIAGNOSIS — G4700 Insomnia, unspecified: Secondary | ICD-10-CM | POA: Diagnosis present

## 2020-10-05 DIAGNOSIS — Z881 Allergy status to other antibiotic agents status: Secondary | ICD-10-CM | POA: Diagnosis not present

## 2020-10-05 DIAGNOSIS — R45851 Suicidal ideations: Secondary | ICD-10-CM | POA: Diagnosis present

## 2020-10-05 DIAGNOSIS — F333 Major depressive disorder, recurrent, severe with psychotic symptoms: Principal | ICD-10-CM | POA: Diagnosis present

## 2020-10-05 DIAGNOSIS — F4310 Post-traumatic stress disorder, unspecified: Secondary | ICD-10-CM | POA: Diagnosis present

## 2020-10-05 DIAGNOSIS — F419 Anxiety disorder, unspecified: Secondary | ICD-10-CM | POA: Diagnosis present

## 2020-10-05 DIAGNOSIS — E876 Hypokalemia: Secondary | ICD-10-CM | POA: Diagnosis present

## 2020-10-05 DIAGNOSIS — J45909 Unspecified asthma, uncomplicated: Secondary | ICD-10-CM | POA: Diagnosis present

## 2020-10-05 DIAGNOSIS — Z91040 Latex allergy status: Secondary | ICD-10-CM | POA: Diagnosis not present

## 2020-10-05 DIAGNOSIS — F29 Unspecified psychosis not due to a substance or known physiological condition: Secondary | ICD-10-CM | POA: Diagnosis not present

## 2020-10-05 DIAGNOSIS — F332 Major depressive disorder, recurrent severe without psychotic features: Secondary | ICD-10-CM | POA: Diagnosis not present

## 2020-10-05 DIAGNOSIS — E871 Hypo-osmolality and hyponatremia: Secondary | ICD-10-CM | POA: Diagnosis present

## 2020-10-05 DIAGNOSIS — Z888 Allergy status to other drugs, medicaments and biological substances status: Secondary | ICD-10-CM

## 2020-10-05 DIAGNOSIS — F209 Schizophrenia, unspecified: Secondary | ICD-10-CM | POA: Diagnosis present

## 2020-10-05 LAB — HEPATIC FUNCTION PANEL
ALT: 17 U/L (ref 0–44)
AST: 22 U/L (ref 15–41)
Albumin: 4.3 g/dL (ref 3.5–5.0)
Alkaline Phosphatase: 55 U/L (ref 38–126)
Bilirubin, Direct: 0.2 mg/dL (ref 0.0–0.2)
Indirect Bilirubin: 0.4 mg/dL (ref 0.3–0.9)
Total Bilirubin: 0.6 mg/dL (ref 0.3–1.2)
Total Protein: 8.2 g/dL — ABNORMAL HIGH (ref 6.5–8.1)

## 2020-10-05 MED ORDER — TRAZODONE HCL 50 MG PO TABS
50.0000 mg | ORAL_TABLET | Freq: Every evening | ORAL | Status: DC | PRN
  Administered 2020-10-05 – 2020-10-08 (×4): 50 mg via ORAL
  Filled 2020-10-05 (×4): qty 1

## 2020-10-05 MED ORDER — HYDROXYZINE HCL 25 MG PO TABS
25.0000 mg | ORAL_TABLET | Freq: Three times a day (TID) | ORAL | Status: DC | PRN
  Administered 2020-10-05 – 2020-10-12 (×7): 25 mg via ORAL
  Filled 2020-10-05 (×6): qty 1

## 2020-10-05 MED ORDER — ACETAMINOPHEN 325 MG PO TABS
650.0000 mg | ORAL_TABLET | Freq: Four times a day (QID) | ORAL | Status: DC | PRN
  Administered 2020-10-07 – 2020-10-13 (×4): 650 mg via ORAL
  Filled 2020-10-05 (×4): qty 2

## 2020-10-05 MED ORDER — MAGNESIUM HYDROXIDE 400 MG/5ML PO SUSP: 30.0000 mL | Freq: Every day | ORAL | Status: DC | PRN

## 2020-10-05 MED ORDER — ACETAMINOPHEN 325 MG PO TABS: 650.0000 mg | ORAL_TABLET | Freq: Four times a day (QID) | ORAL | Status: DC | PRN

## 2020-10-05 MED ORDER — OLANZAPINE 5 MG PO TABS
5.0000 mg | ORAL_TABLET | Freq: Once | ORAL | Status: AC
  Administered 2020-10-05: 5 mg via ORAL
  Filled 2020-10-05 (×2): qty 1

## 2020-10-05 MED ORDER — ALUM & MAG HYDROXIDE-SIMETH 200-200-20 MG/5ML PO SUSP: 30.0000 mL | ORAL | Status: DC | PRN

## 2020-10-05 MED ORDER — RISPERIDONE 0.5 MG PO TABS
0.5000 mg | ORAL_TABLET | Freq: Two times a day (BID) | ORAL | Status: DC
  Filled 2020-10-05 (×2): qty 1

## 2020-10-05 MED ORDER — CITALOPRAM HYDROBROMIDE 20 MG PO TABS
20.0000 mg | ORAL_TABLET | Freq: Every day | ORAL | Status: DC
  Administered 2020-10-06 – 2020-10-13 (×8): 20 mg via ORAL
  Filled 2020-10-05 (×11): qty 1

## 2020-10-05 MED ORDER — CITALOPRAM HYDROBROMIDE 20 MG PO TABS
20.0000 mg | ORAL_TABLET | Freq: Every day | ORAL | Status: DC
  Filled 2020-10-05 (×2): qty 1

## 2020-10-05 MED ORDER — OLANZAPINE 5 MG PO TABS
5.0000 mg | ORAL_TABLET | Freq: Every day | ORAL | Status: DC
  Administered 2020-10-05: 5 mg via ORAL
  Filled 2020-10-05 (×3): qty 1

## 2020-10-05 MED ORDER — OLANZAPINE 5 MG PO TBDP
5.0000 mg | ORAL_TABLET | Freq: Once | ORAL | Status: DC
  Filled 2020-10-05 (×2): qty 1

## 2020-10-05 NOTE — BHH Group Notes (Signed)
.  Psychoeducational Group Note  Date: 10-04-2220 Time: 0900-1000    Goal Setting   Purpose of Group: This group helps to provide patients with the steps of setting a goal that is specific, measurable, attainable, realistic and time specific. A discussion on how we keep ourselves stuck with negative self talk.    Participation Level:  Did not attend   Amy Rowe A  

## 2020-10-05 NOTE — Progress Notes (Signed)
Patient has been accepted to [facility] 407-1 to the services of Dr. Jola Babinski and may arrive as soon as transportation can be arranged.

## 2020-10-05 NOTE — ED Provider Notes (Addendum)
Emergency Medicine Observation Re-evaluation Note  Amy Rowe is a 45 y.o. female, seen on rounds today.  Pt initially presented to the ED for complaints of Hallucinations Currently, the patient is cooperative.  Physical Exam  BP 102/71 (BP Location: Left Arm)   Pulse 63   Temp 98.6 F (37 C) (Oral)   Resp 17   Ht 5\' 3"  (1.6 m)   Wt 90.2 kg   LMP  (LMP Unknown)   SpO2 96%   BMI 35.23 kg/m  Physical Exam General: No acute distress Cardiac: Regular rate and rhythm Lungs: Clear to auscultation Psych: Cooperative  ED Course / MDM  EKG:    I have reviewed the labs performed to date as well as medications administered while in observation.  Recent changes in the last 24 hours include no major changes.  Plan  Current plan is for inpatient bed search. Patient is not under full IVC at this time. Covid positive no symptoms.   , MD 10/05/20 [phone]  Informed that the patient has been accepted to [facility] 407-[facility] by Dr [facility], Mart Piggs, MD 10/05/20 [id number]
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
## 2020-10-05 NOTE — Progress Notes (Signed)
Adult Psychoeducational Group Note  Date:  10/05/2020 Time:  8:52 PM  Group Topic/Focus:  Wrap-Up Group:   The focus of this group is to help patients review their daily goal of treatment and discuss progress on daily workbooks.  Participation Level:  Active  Participation Quality:  Appropriate  Affect:  Appropriate  Cognitive:  Appropriate  Insight: Appropriate  Engagement in Group:  Developing/Improving  Modes of Intervention:  Discussion  Additional Comments: Pt is a new admission. Pt stated her goal for today was to focus on her treatment plan. Pt stated she felt she accomplished her goal today. Pt stated she talk with her doctor and social worker, regarding her care today. Pt stated she took all her medication today from her providers. Pt stated been able to contact her mother daughter today improved her overall day. Pt stated her relationship with her family and support team has improved since she was admitted here. Pt rated her overall day a 8 out of 10 today. Pt stated she felt better about herself today. Pt stated her appetite was fair today and she received all meals today. Pt stated her sleep last night was fair. Pt stated the goal for tonight was to get some rest. Pt stated she was in some physical pain. Pt stated she had pain in her right shoulder. Pt stated it was a severe pain with a rating on the pain rating scale of an 8. Pt nurse was updated on situation.Pt deny visual hallucinations. Pt admitted to experiencing some auditory issues tonight. Pt stated the last time she experienced this was about 5 mins ago. Pt nurse was updated on situation.Pt denies thoughts of harming herself or others. Pt stated she would alert staff if anything changes.  Amy Rowe 10/05/2020, 8:52 PM
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
## 2020-10-05 NOTE — Progress Notes (Signed)
Patient meets criteria for inpatient treatment. No available beds at [facility] currently. CSW faxed referrals to the following facilities for review:  [facility] [place] [facility] Pollen  TTS will continue to seek bed placement.   Trula Slade, MSW, LCSW Clinical Social Worker 10/05/2020 8:33 AM
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
## 2020-10-05 NOTE — [facility] Group Notes (Signed)
.  Psychoeducational Group Note  Date: 10-04-2020 Time: 0900-1000    Goal Setting   Purpose of Group: This group helps to provide patients with the steps of setting a goal that is specific, measurable, attainable, realistic and time specific. A discussion on how we keep ourselves stuck with negative self talk.    Participation Level:  Active  Participation Quality:  Appropriate  Affect:  Appropriate  Cognitive:  Appropriate  Insight:  Improving  Engagement in Group:  Engaged  Additional Comments:  Pt attend the group and partiapated fully in the group. Working on her love language assessment. Believes she is a person who doesn't make demands on others and "as long as they are happy, I am happy"  Dione Housekeeper
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
## 2020-10-06 LAB — LIPID PANEL
Cholesterol: 259 mg/dL — ABNORMAL HIGH (ref 0–200)
HDL: 69 mg/dL (ref >40)
LDL Cholesterol: 161 mg/dL — ABNORMAL HIGH (ref 0–99)
Total CHOL/HDL Ratio: 3.8 RATIO
Triglycerides: 145 mg/dL (ref <150)
VLDL: 29 mg/dL (ref 0–40)

## 2020-10-06 LAB — TSH: TSH: 2.515 u[IU]/mL (ref 0.350–4.500)

## 2020-10-06 MED ORDER — OLANZAPINE 10 MG PO TABS
10.0000 mg | ORAL_TABLET | Freq: Every day | ORAL | Status: DC
  Administered 2020-10-06 – 2020-10-10 (×5): 10 mg via ORAL
  Filled 2020-10-06 (×8): qty 1

## 2020-10-06 NOTE — [facility] Counselor (Signed)
CSW attempted to meet with this patient to complete PSA, however this patient was asleep and could not be woken.    Ruthann Cancer MSW, LCSW Clincal Social Worker  [facility]

## 2020-10-06 NOTE — Progress Notes (Signed)
Progress note    10/06/20 0750  Psych Admission Type (Psych Patients Only)  Admission Status Voluntary  Psychosocial Assessment  Patient Complaints Anxiety  Eye Contact Fair  Facial Expression Animated;Anxious  Affect Anxious;Appropriate to circumstance  Speech Logical/coherent  Interaction Assertive  Motor Activity Fidgety  Appearance/Hygiene Unremarkable  Behavior Characteristics Cooperative;Appropriate to situation;Anxious  Mood Anxious;Pleasant  Thought Process  Coherency Circumstantial  Content Delusions;Paranoia  Delusions Paranoid;Somatic  Perception Hallucinations  Hallucination Auditory;Tactile  Judgment Poor  Confusion None  Danger to Self  Current suicidal ideation? Denies  Danger to Others  Danger to Others None reported or observed

## 2020-10-06 NOTE — [facility] Group Notes (Signed)
Type of Therapy: Group Therapy  Modes of Intervention: Boundaries   Due to the COVID-19 pandemic, this group has been supplemented with worksheets.  Summary of Progress/Problems: CSW provided worksheet to patient due to COVID. CSW offered to meet individually with patient as needed.  Fredirick Lathe, LCSWA Clinicial Social Worker [employer]

## 2020-10-06 NOTE — Tx Team (Signed)
Interdisciplinary Treatment and Diagnostic Plan Update  10/06/2020 Time of Session: 9:15am Amy Rowe MRN: [id number]  Principal Diagnosis: MDD (major depressive disorder), recurrent, severe, with psychosis ([place])  Secondary Diagnoses: Principal Problem:   MDD (major depressive disorder), recurrent, severe, with psychosis ([place]) Active Problems:   Severe episode of recurrent major depressive disorder, without psychotic features ([place])   Major depressive disorder, recurrent severe without psychotic features ([place])   Current Medications:  Current Facility-Administered Medications  Medication Dose Route Frequency Provider Last Rate Last Admin  . acetaminophen (TYLENOL) tablet 650 mg  650 mg Oral Q6H PRN Merlyn Lot E, NP      . alum & mag hydroxide-simeth (MAALOX/MYLANTA) 200-200-20 MG/5ML suspension 30 mL  30 mL Oral Q4H PRN Caroline Sauger, NP      . citalopram (CELEXA) tablet 20 mg  20 mg Oral Daily Nwoko, Herbert Pun I, NP   20 mg at 10/06/20 0749  . hydrOXYzine (ATARAX/VISTARIL) tablet 25 mg  25 mg Oral TID PRN Mallie Darting, NP   25 mg at 10/05/20 2108  . magnesium hydroxide (MILK OF MAGNESIA) suspension 30 mL  30 mL Oral Daily PRN Merlyn Lot E, NP      . OLANZapine (ZYPREXA) tablet 10 mg  10 mg Oral QHS Sharma Covert, MD      . traZODone (DESYREL) tablet 50 mg  50 mg Oral QHS PRN Mallie Darting, NP   50 mg at 10/05/20 2107   PTA Medications: Medications Prior to Admission  Medication Sig Dispense Refill Last Dose  . acetaminophen (TYLENOL) 325 MG tablet Take 2 tablets (650 mg total) by mouth every 6 (six) hours as needed. 30 tablet 0   . citalopram (CELEXA) 20 MG tablet Take 1 tablet (20 mg total) by mouth daily. 30 tablet 0   . hydrOXYzine (ATARAX/VISTARIL) 25 MG tablet Take 1 tablet (25 mg total) by mouth 3 (three) times daily as needed for anxiety. 30 tablet 0   . traZODone (DESYREL) 50 MG tablet Take 1 tablet (50 mg total) by mouth at bedtime as needed for sleep.  30 tablet 0     Patient Stressors: Financial difficulties Medication change or noncompliance Occupational concerns Traumatic event  Patient Strengths: Capable of independent living Communication skills Religious Affiliation Supportive family/friends Work skills  Treatment Modalities: Medication Management, Group therapy, Case management,  1 to 1 session with clinician, Psychoeducation, Recreational therapy.   Physician Treatment Plan for Primary Diagnosis: MDD (major depressive disorder), recurrent, severe, with psychosis ([place]) Long Term Goal(s): Improvement in symptoms so as ready for discharge Improvement in symptoms so as ready for discharge   Short Term Goals: Ability to verbalize feelings will improve Ability to disclose and discuss suicidal ideas Ability to demonstrate self-control will improve Ability to identify and develop effective coping behaviors will improve Ability to maintain clinical measurements within normal limits will improve Compliance with prescribed medications will improve  Medication Management: Evaluate patient's response, side effects, and tolerance of medication regimen.  Therapeutic Interventions: 1 to 1 sessions, Unit Group sessions and Medication administration.  Evaluation of Outcomes: Not Met  Physician Treatment Plan for Secondary Diagnosis: Principal Problem:   MDD (major depressive disorder), recurrent, severe, with psychosis ([place]) Active Problems:   Severe episode of recurrent major depressive disorder, without psychotic features ([place])   Major depressive disorder, recurrent severe without psychotic features ([place])  Long Term Goal(s): Improvement in symptoms so as ready for discharge Improvement in symptoms so as ready for discharge   Short Term  Goals: Ability to verbalize feelings will improve Ability to disclose and discuss suicidal ideas Ability to demonstrate self-control will improve Ability to identify and develop effective  coping behaviors will improve Ability to maintain clinical measurements within normal limits will improve Compliance with prescribed medications will improve     Medication Management: Evaluate patient's response, side effects, and tolerance of medication regimen.  Therapeutic Interventions: 1 to 1 sessions, Unit Group sessions and Medication administration.  Evaluation of Outcomes: Not Met   RN Treatment Plan for Primary Diagnosis: MDD (major depressive disorder), recurrent, severe, with psychosis ([place]) Long Term Goal(s): Knowledge of disease and therapeutic regimen to maintain health will improve  Short Term Goals: Ability to remain free from injury will improve, Ability to verbalize frustration and anger appropriately will improve, Ability to identify and develop effective coping behaviors will improve and Compliance with prescribed medications will improve  Medication Management: RN will administer medications as ordered by provider, will assess and evaluate patient's response and provide education to patient for prescribed medication. RN will report any adverse and/or side effects to prescribing provider.  Therapeutic Interventions: 1 on 1 counseling sessions, Psychoeducation, Medication administration, Evaluate responses to treatment, Monitor vital signs and CBGs as ordered, Perform/monitor CIWA, COWS, AIMS and Fall Risk screenings as ordered, Perform wound care treatments as ordered.  Evaluation of Outcomes: Not Met   LCSW Treatment Plan for Primary Diagnosis: MDD (major depressive disorder), recurrent, severe, with psychosis ([place]) Long Term Goal(s): Safe transition to appropriate next level of care at discharge, Engage patient in therapeutic group addressing interpersonal concerns.  Short Term Goals: Engage patient in aftercare planning with referrals and resources, Increase social support, Increase ability to appropriately verbalize feelings, Identify triggers associated with  mental health/substance abuse issues and Increase skills for wellness and recovery  Therapeutic Interventions: Assess for all discharge needs, 1 to 1 time with Social worker, Explore available resources and support systems, Assess for adequacy in community support network, Educate family and significant other(s) on suicide prevention, Complete Psychosocial Assessment, Interpersonal group therapy.  Evaluation of Outcomes: Not Met   Progress in Treatment: Attending groups: Yes. Participating in groups: Yes. Taking medication as prescribed: Yes. Toleration medication: Yes. Family/Significant other contact made: No, will contact:  if consent is provided Patient understands diagnosis: Yes. Discussing patient identified problems/goals with staff: Yes. Medical problems stabilized or resolved: Yes. Denies suicidal/homicidal ideation: Yes. Issues/concerns per patient self-inventory: No.   New problem(s) identified: No, Describe:  none  New Short Term/Long Term Goal(s): medication stabilization, elimination of SI thoughts, development of comprehensive mental wellness plan.   Patient Goals:  Did not attend  Discharge Plan or Barriers: .Patient recently admitted. CSW will continue to follow and assess for appropriate referrals and possible discharge planning.   Reason for Continuation of Hospitalization: Hallucinations Medication stabilization Suicidal ideation  Estimated Length of Stay: 3-5 days  Attendees: Patient: Did not attend 10/06/2020   Physician: Lala Lund, MD 10/06/2020   Nursing:  10/06/2020   RN Care Manager: 10/06/2020   Social Worker: Darletta Moll, LCSW 10/06/2020   Recreational Therapist:  10/06/2020  Other:  10/06/2020   Other:  10/06/2020   Other: 10/06/2020     Scribe for Treatment Team: Vassie Moselle, LCSW 10/06/2020 11:28 AM

## 2020-10-06 NOTE — Progress Notes (Signed)
[facility] MD Progress Note  10/06/2020 11:42 AM Amy Rowe  MRN:  [id number] Subjective: Patient is a 45 year old female with a reported past psychiatric history significant for major depression with psychotic features who presented to the [facility] emergency department on 10/03/2020 with complaint of new onset auditory hallucinations since June 2021.  The voices were telling her to kill her self and not to eat.  Objective: Patient is seen and examined.  Patient is a 45 year old female with the above-stated past psychiatric history who is seen in follow-up.  She stated that the auditory hallucinations are better than yesterday.  She continues to hear them but only slightly.  When asked about whether they are located in her head or outside of her head she stated she feels like the right inside of her ear.  Again she stated that they have been present since June 2021.  I asked her about any recent stressors that could have exacerbated things to make her want to come to the hospital now versus several months ago, and she denied having any problem.  She did state that her aunt had encouraged her to go to the hospital.  The patient stated that she and the voices were having arguments, and that the patient had become irritable and that may have been why her aunt encouraged her to go to the hospital.  She talks about her previous trauma, but denied nightmares and flashbacks of that event.  When asked about whether the voices were present on that previous psychiatric hospitalization she stated that they had not been.  She denied any stressors in June that may have exacerbated things.  She stated that her aunt had moved into the patient's home in approximately November, and this was because of financial issues.  She stated that her suicidal ideation has decreased.  She was able to smile and engage to a degree.  No visual hallucinations.  Her vital signs are stable, she is afebrile.  Her pulse oximetry on room air was  99%.  She denied any respiratory problems with regard to the positive Covid test.  Review of her laboratories from 1/28 showed a low potassium.  Unfortunately it does not appear that that was supplemented and we will do that today.  She slept well at 6.25 hours.  Review of the nursing notes showed she had some shoulder pain yesterday, but otherwise no new issues.  Principal Problem: MDD (major depressive disorder), recurrent, severe, with psychosis ([facility]) Diagnosis: Principal Problem:   MDD (major depressive disorder), recurrent, severe, with psychosis ([facility]) Active Problems:   Severe episode of recurrent major depressive disorder, without psychotic features ([facility])   Major depressive disorder, recurrent severe without psychotic features ([facility])  Total Time spent with patient: 20 minutes  Past Psychiatric History: See admission H&P  Past Medical History:  Past Medical History:  Diagnosis Date  . Anxiety   . Asthma   . Bronchitis   . Depression     Past Surgical History:  Procedure Laterality Date  . CESAREAN SECTION     Family History: History reviewed. No pertinent family history. Family Psychiatric  History: See admission H&P Social History:  Social History   Substance and Sexual Activity  Alcohol Use Yes   Comment: socially     Social History   Substance and Sexual Activity  Drug Use No    Social History   Socioeconomic History  . Marital status: Divorced    Spouse name: Not on file  . Number of children:  Not on file  . Years of education: Not on file  . Highest education level: Not on file  Occupational History  . Not on file  Tobacco Use  . Smoking status: Current Every Day Smoker    Types: Cigarettes  . Smokeless tobacco: Never Used  Substance and Sexual Activity  . Alcohol use: Yes    Comment: socially  . Drug use: No  . Sexual activity: Not on file  Other Topics Concern  . Not on file  Social History Narrative  . Not on file   Social Determinants of  Health   Financial Resource Strain: Not on file  Food Insecurity: Not on file  Transportation Needs: Not on file  Physical Activity: Not on file  Stress: Not on file  Social Connections: Not on file   Additional Social History:                         Sleep: Good  Appetite:  Good  Current Medications: Current Facility-Administered Medications  Medication Dose Route Frequency Provider Last Rate Last Admin  . acetaminophen (TYLENOL) tablet 650 mg  650 mg Oral Q6H PRN Ophelia Shoulder E, NP      . alum & mag hydroxide-simeth (MAALOX/MYLANTA) 200-200-20 MG/5ML suspension 30 mL  30 mL Oral Q4H PRN Gillermo Murdoch, NP      . citalopram (CELEXA) tablet 20 mg  20 mg Oral Daily Nwoko, Nicole Kindred I, NP   20 mg at 10/06/20 0749  . hydrOXYzine (ATARAX/VISTARIL) tablet 25 mg  25 mg Oral TID PRN Chales Abrahams, NP   25 mg at 10/05/20 2108  . magnesium hydroxide (MILK OF MAGNESIA) suspension 30 mL  30 mL Oral Daily PRN Chales Abrahams, NP      . OLANZapine (ZYPREXA) tablet 10 mg  10 mg Oral QHS Antonieta Pert, MD      . traZODone (DESYREL) tablet 50 mg  50 mg Oral QHS PRN Chales Abrahams, NP   50 mg at 10/05/20 2107    Lab Results:  Results for orders placed or performed during the hospital encounter of 10/05/20 (from the past 48 hour(s))  Hepatic function panel     Status: Abnormal   Collection Time: 10/05/20  5:57 PM  Result Value Ref Range   Total Protein 8.2 (H) 6.5 - 8.1 g/dL   Albumin 4.3 3.5 - 5.0 g/dL   AST 22 15 - 41 U/L   ALT 17 0 - 44 U/L   Alkaline Phosphatase 55 38 - 126 U/L   Total Bilirubin 0.6 0.3 - 1.2 mg/dL   Bilirubin, Direct 0.2 0.0 - 0.2 mg/dL   Indirect Bilirubin 0.4 0.3 - 0.9 mg/dL    Comment: Performed at [facility], [address], [place], [place] [address]    Blood Alcohol level:  Lab Results  Component Value Date   ETH <10 10/03/2020   ETH 70 (H) 08/13/2015    Metabolic Disorder Labs: Lab Results  Component Value Date    HGBA1C 6.1 (H) 08/14/2015   MPG 128 08/14/2015   No results found for: PROLACTIN Lab Results  Component Value Date   CHOL 220 (H) 08/14/2015   TRIG 98 08/14/2015   HDL 62 08/14/2015   CHOLHDL 3.5 08/14/2015   VLDL 20 08/14/2015   LDLCALC 138 (H) 08/14/2015    Physical Findings: AIMS: Facial and Oral Movements Muscles of Facial Expression: None, normal Lips and Perioral Area: None, normal Jaw: None,  normal Tongue: None, normal,Extremity Movements Upper (arms, wrists, hands, fingers): None, normal Lower (legs, knees, ankles, toes): None, normal, Trunk Movements Neck, shoulders, hips: None, normal, Overall Severity Severity of abnormal movements (highest score from questions above): None, normal Incapacitation due to abnormal movements: None, normal Patient's awareness of abnormal movements (rate only patient's report): No Awareness, Dental Status Current problems with teeth and/or dentures?: No Does patient usually wear dentures?: No  CIWA:    COWS:     Musculoskeletal: Strength & Muscle Tone: within normal limits Gait & Station: normal Patient leans: N/A  Psychiatric Specialty Exam: Physical Exam Vitals and nursing note reviewed.  Constitutional:      Appearance: Normal appearance.  HENT:     Head: Normocephalic and atraumatic.  Pulmonary:     Effort: Pulmonary effort is normal.  Neurological:     General: No focal deficit present.     Mental Status: She is alert and oriented to person, place, and time.     Review of Systems  Blood pressure 136/78, pulse 99, temperature 98.7 F (37.1 C), resp. rate 20, height 5\' 3"  (1.6 m), weight 90.2 kg, SpO2 100 %.Body mass index is 35.23 kg/m.  General Appearance: Disheveled  Eye Contact:  Fair  Speech:  Normal Rate  Volume:  Decreased  Mood:  Anxious and Depressed  Affect:  Congruent  Thought Process:  Coherent and Descriptions of Associations: Intact  Orientation:  Full (Time, Place, and Person)  Thought Content:   Hallucinations: Auditory  Suicidal Thoughts:  Yes.  without intent/plan  Homicidal Thoughts:  No  Memory:  Immediate;   Fair Recent;   Fair Remote;   Fair  Judgement:  Intact  Insight:  Fair  Psychomotor Activity:  Normal  Concentration:  Concentration: Fair and Attention Span: Fair  Recall:  of Knowledge:  Fair  Language:  Good  Akathisia:  Negative  Handed:  Right  AIMS (if indicated):     Assets:  Desire for Improvement Housing Resilience Social Support  ADL's:  Intact  Cognition:  WNL  Sleep:  Number of Hours: 6.25     Treatment Plan Summary: Daily contact with patient to assess and evaluate symptoms and progress in treatment, Medication management and Plan : Patient is seen and examined.  Patient is a 45 year old female with the above-stated past psychiatric history who is seen in follow-up.   Diagnosis: 1.  Major depression, severe with psychotic features versus unspecified psychotic disorder. 2.  History of posttraumatic stress disorder. 3.  Positive Covid test  Pertinent findings on examination today: 1.  Auditory hallucinations have decreased. 2.  Suicidal ideation has decreased. 3.  No evidence of any active Covid infection symptoms.  Plan: 1.  Obtain hemoglobin A1c, lipid panel and a TSH this p.m. 2.  Continue Celexa 20 mg p.o. daily for anxiety and depression. 3.  Continue hydroxyzine 25 mg p.o. 3 times daily as needed anxiety. 4.  Increase Zyprexa to 10 mg p.o. nightly for psychosis. 5.  Continue trazodone 50 mg p.o. nightly as needed insomnia. 6.  Continue to monitor for Covid symptoms. 7.  Disposition planning-in progress.  47, MD 10/06/2020, 11:42 AM

## 2020-10-06 NOTE — Plan of Care (Signed)
  Problem: Education: Goal: Knowledge of [facility] Education information/materials will improve Outcome: Progressing Goal: Emotional status will improve Outcome: Progressing Goal: Mental status will improve Outcome: Progressing Goal: Verbalization of understanding the information provided will improve Outcome: Progressing   

## 2020-10-06 NOTE — Progress Notes (Addendum)
Pt is visible in the milieu mostly reading with minimal interaction with staff or peers. She denied SI/HI/VH but continues to endorse AH "voices that wake me up at night and I feel this electric current in my body." She stated that she is trying to ignore the voices and praying that the medication will help. Pt ate her HS snacks and was med compliant. She requested for a sleeping aid and she was administered PRN Trazodone pending results. She was observed in the bathroom for over 30 minutes and when asked what is going on, she reported that "my body is telling me to get rid of something." When asked if she was feeling constipated, she responded no and that she had a BM earlier in the day. She denied any discomfort/pain at this time. No falls or unsafe behavior noted thus far. Q15 minute observations maintained for safety and support provided as needed.   10/06/20 2300  Psych Admission Type (Psych Patients Only)  Admission Status Voluntary  Psychosocial Assessment  Patient Complaints Other (Comment) (Voices in her head)  Eye Contact Fair  Facial Expression Animated  Affect Preoccupied  Speech Logical/coherent  Interaction Assertive  Motor Activity Other (Comment) (Unremarkable)  Appearance/Hygiene Unremarkable  Behavior Characteristics Cooperative;Appropriate to situation;Calm  Mood Preoccupied;Pleasant  Thought Process  Coherency Circumstantial  Content Delusions  Delusions Paranoid;Somatic  Perception Hallucinations  Hallucination Auditory;Tactile  Judgment Poor  Confusion None  Danger to Self  Current suicidal ideation? Denies  Danger to Others  Danger to Others None reported or observed

## 2020-10-07 DIAGNOSIS — F209 Schizophrenia, unspecified: Secondary | ICD-10-CM | POA: Diagnosis present

## 2020-10-07 DIAGNOSIS — F29 Unspecified psychosis not due to a substance or known physiological condition: Secondary | ICD-10-CM

## 2020-10-07 LAB — HEMOGLOBIN A1C
Hgb A1c MFr Bld: 5.6 % (ref 4.8–5.6)
Mean Plasma Glucose: 114.02 mg/dL
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
## 2020-10-07 NOTE — [facility] Suicide Risk Assessment (Signed)
[facility] INPATIENT:  Family/Significant Other Suicide Prevention Education  Suicide Prevention Education:  Education Completed; Aaleeyah Bias [phone] (Daughter) has been identified by the patient as the family member/significant other with whom the patient will be residing, and identified as the person(s) who will aid the patient in the event of a mental health crisis (suicidal ideations/suicide attempt).  With written consent from the patient, the family member/significant other has been provided the following suicide prevention education, prior to the and/or following the discharge of the patient.  The suicide prevention education provided includes the following:  Suicide risk factors  Suicide prevention and interventions  National Suicide Hotline telephone number  [facility] assessment telephone number  [facility] Emergency Assistance 911  [facility] and/or Residential Mobile Crisis Unit telephone number  Request made of family/significant other to:  Remove weapons (e.g., guns, rifles, knives), all items previously/currently identified as safety concern.    Remove drugs/medications (over-the-counter, prescriptions, illicit drugs), all items previously/currently identified as a safety concern.  The family member/significant other verbalizes understanding of the suicide prevention education information provided.  The family member/significant other agrees to remove the items of safety concern listed above.  CSW spoke with Mrs. Jean Rosenthal who states that her mother received a retinal scan at her place of employment and then began hearing voices.  Ms. Jean Rosenthal states this happened in June of last year.  Mrs. Jean Rosenthal states that her mother did not hear voices or have paranoia before this retinal scan.  Ms. Jean Rosenthal confirms that her mother lives with her aunt but states that the home belongs to her mother but her aunt lives there and helps with the bills.  Ms. Jean Rosenthal  states that she does not want her mother to leave if she continues to hear voices.  Ms. Jean Rosenthal states that there are no firearms or weapons in the home.  CSW completed SPE with Ms. Jean Rosenthal.   Met[employer] K[employer] [place] Cost 10/07/2020, 12:42 PM

## 2020-10-07 NOTE — Progress Notes (Signed)
   10/07/20 0906  Vital Signs  Temp 98.3 F (36.8 C)  Pulse Rate 62  Resp 16  BP 122/75  Patient Position (if appropriate) Sitting   D: Patient denies SI/HI. Patient admits to some voices talking to her but that they are quieter than usual. Pt.rated both anxiety and depression  2/10.Pt. out in open areas. Pt. Complained about loss of range of motion and pain in right shoulder.  A:  Patient took scheduled medicine. Pt. Given 650 mg of Tylenol for shoulder pain which did not relieve the pain.  Support and encouragement provided Routine safety checks conducted every 15 minutes. Patient  Informed to notify staff with any concerns.  R:   Safety maintained.

## 2020-10-07 NOTE — Progress Notes (Signed)
[facility] MD Progress Note  10/07/2020 11:52 AM Amy Rowe  MRN:  [id number]   Chief Complaint: auditory hallucinations  Subjective: Amy Rowe is a 45 y.o. female with a history of MDD, who was initially admitted for inpatient psychiatric hospitalization on 10/05/2020 for management of command AH telling her not to eat and to kill herself. The patient is currently on Hospital Day 2.   Chart Review from last 24 hours:  The patient's chart was reviewed and nursing notes were reviewed. The patient's case was discussed in multidisciplinary team meeting. Per review of nursing notes, the patient has endorsed ongoing AH and somatic delusions of "an electric current in her body" and belief that her body is telling her "to get rid of something." Per MAR she has been compliant with scheduled medications and did receive PRN Trazodone for sleep. She has remained afebrile with oxygen sats 100% on RA.  Information Obtained Today During Patient Interview: The patient was seen and evaluated on the unit. On assessment today the patient reports that she has a mild cough but no SOB, CP, or fever. She denies SI or HI. She reports ongoing internal AH of multiple voices that are both female and female and who are having conversations she can Nurse, mental health. She describes belief that the internal voices "know each other" and are trying to control her thoughts and behaviors. She describes concern that she could potentially "have a microchip" implanted in her body and describes belief that she and the voices are on "the same frequency." She states that when she showers, some of the voices who "are on my same frequency" tell her they are aggravated with her. She denies current command AH and denies VH. She states that prior to admission she cut off the power and water in her home due to concern that metered devices were contributing to her AH. She admits the residual AH are improving in terms of frequency and intensity. She denies  current ideas of reference. She denies current feelings of depression and denies having a severe depressive episode prior to admission. She denies h/o TBI. We discussed that the team would like to do a medical w/u to r/o an organic etiology to her current symptoms and she agrees with this plan.   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 The direct patient care time included face-to-face time with the patient, reviewing the patient's chart, communicating with other professionals, and coordinating care. Greater than 50% of this time was spent in counseling or coordinating care with the patient regarding goals of hospitalization, psycho-education, and discharge planning needs.  Past Psychiatric History: see admission H&P  Past Medical History:  Past Medical History:  Diagnosis Date  . Anxiety   . Asthma   . Bronchitis   . Depression     Past Surgical History:  Procedure Laterality Date  . CESAREAN SECTION     Family History: History reviewed. No pertinent family history.   Family Psychiatric  History: see admission H&P  Social History:  Social History   Substance and Sexual Activity  Alcohol Use Yes   Comment: socially     Social History   Substance and Sexual Activity  Drug Use No    Social History   Socioeconomic  History  . Marital status: Divorced    Spouse name: Not on file  . Number of children: Not on file  . Years of education: Not on file  . Highest education level: Not on file  Occupational History  . Not on file  Tobacco Use  . Smoking status: Current Every Day Smoker    Types: Cigarettes  . Smokeless tobacco: Never Used  Substance and Sexual Activity  . Alcohol use: Yes     Comment: socially  . Drug use: No  . Sexual activity: Not on file  Other Topics Concern  . Not on file  Social History Narrative  . Not on file   Social Determinants of Health   Financial Resource Strain: Not on file  Food Insecurity: Not on file  Transportation Needs: Not on file  Physical Activity: Not on file  Stress: Not on file  Social Connections: Not on file   Sleep: Good  Appetite:  Fair  Current Medications: Current Facility-Administered Medications  Medication Dose Route Frequency Provider Last Rate Last Admin  . acetaminophen (TYLENOL) tablet 650 mg  650 mg Oral Q6H PRN Merlyn Lot E, NP   650 mg at 10/07/20 0802  . alum & mag hydroxide-simeth (MAALOX/MYLANTA) 200-200-20 MG/5ML suspension 30 mL  30 mL Oral Q4H PRN Caroline Sauger, NP      . citalopram (CELEXA) tablet 20 mg  20 mg Oral Daily Lindell Spar I, NP   20 mg at 10/07/20 0802  . hydrOXYzine (ATARAX/VISTARIL) tablet 25 mg  25 mg Oral TID PRN Mallie Darting, NP   25 mg at 10/05/20 2108  . magnesium hydroxide (MILK OF MAGNESIA) suspension 30 mL  30 mL Oral Daily PRN Merlyn Lot E, NP      . OLANZapine (ZYPREXA) tablet 10 mg  10 mg Oral QHS Sharma Covert, MD   10 mg at 10/06/20 2108  . traZODone (DESYREL) tablet 50 mg  50 mg Oral QHS PRN Mallie Darting, NP   50 mg at 10/06/20 2108   Lab Results:  Results for orders placed or performed during the hospital encounter of 10/05/20 (from the past 48 hour(s))  Hepatic function panel     Status: Abnormal   Collection Time: 10/05/20  5:57 PM  Result Value Ref Range   Total Protein 8.2 (H) 6.5 - 8.1 g/dL   Albumin 4.3 3.5 - 5.0 g/dL   AST 22 15 - 41 U/L   ALT 17 0 - 44 U/L   Alkaline Phosphatase 55 38 - 126 U/L   Total Bilirubin 0.6 0.3 - 1.2 mg/dL   Bilirubin, Direct 0.2 0.0 - 0.2 mg/dL   Indirect Bilirubin 0.4 0.3 - 0.9 mg/dL    Comment: Performed at [facility], [place] [address] [place], [place] [address]  Hemoglobin A1c      Status: None   Collection Time: 10/06/20  5:45 PM  Result Value Ref Range   Hgb A1c MFr Bld 5.6 4.8 - 5.6 %    Comment: (NOTE) Pre diabetes:          5.7%-6.4%  Diabetes:              >6.4%  Glycemic control for   <7.0% adults with diabetes    Mean Plasma Glucose 114.02 mg/dL    Comment: Performed at [facility] [address], [place], [place] [address]  Lipid panel     Status: Abnormal   Collection Time: 10/06/20  5:45 PM  Result Value Ref Range   Cholesterol 259 (H) 0 - 200 mg/dL   Triglycerides 145 <150 mg/dL   HDL 69 >40 mg/dL   Total CHOL/HDL Ratio 3.8 RATIO   VLDL 29 0 - 40 mg/dL   LDL Cholesterol 161 (H) 0 - 99 mg/dL    Comment:        Total Cholesterol/HDL:CHD Risk Coronary Heart Disease Risk Table                     Men   Women  1/2 Average Risk   3.4   3.3  Average Risk       5.0   4.4  2 X Average Risk   9.6   7.1  3 X Average Risk  23.4   11.0        Use the calculated Patient Ratio above and the CHD Risk Table to determine the patient's CHD Risk.        ATP III CLASSIFICATION (LDL):  <100     mg/dL   Optimal  100-129  mg/dL   Near or Above                    Optimal  130-159  mg/dL   Borderline  160-189  mg/dL   High  >190     mg/dL   Very High Performed at [facility] [address] [place], [place] [address]   TSH     Status: None   Collection Time: 10/06/20  5:45 PM  Result Value Ref Range   TSH 2.515 0.350 - 4.500 uIU/mL    Comment: Performed by a 3rd Generation assay with a functional sensitivity of <=0.01 uIU/mL. Performed at [facility] [facility], [place] [address] [place], [place] [address]     Blood Alcohol level:  Lab Results  Component Value Date   ETH <10 10/03/2020   ETH 70 (H) 53/00/5110    Metabolic Disorder Labs: Lab Results  Component Value Date   HGBA1C 5.6 10/06/2020   MPG 114.02 10/06/2020   MPG 128 08/14/2015   No results found for: PROLACTIN Lab Results  Component Value  Date   CHOL 259 (H) 10/06/2020   TRIG 145 10/06/2020   HDL 69 10/06/2020   CHOLHDL 3.8 10/06/2020   VLDL 29 10/06/2020   LDLCALC 161 (H) 10/06/2020   LDLCALC 138 (H) 08/14/2015   Physical Findings: AIMS: Facial and Oral Movements Muscles of Facial Expression: None, normal Lips and Perioral Area: None, normal Jaw: None, normal Tongue: None, normal,Extremity Movements Upper (arms, wrists, hands, fingers): None, normal Lower (legs, knees, ankles, toes): None, normal, Trunk Movements Neck, shoulders, hips: None, normal, Overall Severity Severity of abnormal movements (highest score from questions above): None, normal Incapacitation due to abnormal movements: None, normal Patient's awareness of abnormal movements (rate only patient's report): No Awareness, Dental Status Current problems with teeth and/or dentures?: No Does patient usually wear dentures?: No   Musculoskeletal: Strength & Muscle Tone: within normal limits Gait & Station: normal , steady Patient leans: N/A  Psychiatric Specialty Exam: Physical Exam Vitals reviewed.  HENT:     Head: Normocephalic.  Pulmonary:     Effort: Pulmonary effort is normal.  Neurological:     Mental Status: She is alert.     Review of Systems  Constitutional: Negative for fever.  HENT: Negative for congestion.   Respiratory: Negative for shortness of breath.   Cardiovascular: Negative for chest pain.  Blood pressure 122/75, pulse 62, temperature 98.3 F (36.8 C), resp. rate 16, height 5' 3"  (1.6 m), weight 90.2 kg, SpO2 100 %.Body mass index is 35.23 kg/m.  General Appearance: fair hygiene, appears stated age  Eye Contact:  Good  Speech:  Clear and Coherent and Normal Rate  Volume:  Normal  Mood:  anxious  Affect:  congruent  Thought Process:  Goal Directed  Orientation:  Full (Time, Place, and Person)  Thought Content:  endorses AH and sense of being controlled by the voices; reports sense of being on "a frequency" with the  voices; does not appear to be grossly responding to internal/external stimuli on exam; endorses paranoia in context of AH; denies VH or ideas of reference  Suicidal Thoughts:  Denied  Homicidal Thoughts:  Denied  Memory:  Recent;   Fair  Judgement:  Fair  Insight:  Fair  Psychomotor Activity:  Normal  Concentration:  Concentration: Fair and Attention Span: Fair  Recall:  [employer] of Knowledge:  Fair  Language:  Good  Akathisia:  Negative  Assets:  Desire for Improvement Resilience Social Support  ADL's:  Intact  Cognition:  WNL  Sleep:  Number of Hours: 4.25   Treatment Plan Summary:  ASSESSMENT: Diagnoses / Active Problems: Unspecified schizophrenia spectrum and other psychotic d/o (r/o MDD recurrent severe with psychotic features, r/o schizoaffective d/o, r/o psychotic d/o secondary to general medical condition)  PLAN: 1. Safety and Monitoring:  -- Voluntary admission to inpatient psychiatric unit for safety, stabilization and treatment  -- Daily contact with patient to assess and evaluate symptoms and progress in treatment  -- Patient's case to be discussed in multi-disciplinary team meeting  -- Observation Level : q15 minute checks  -- Vital signs:  q12 hours  -- Precautions: suicide and COVID  2. Psychiatric Diagnoses and Treatment:   Unspecified schizophrenia spectrum and other psychotic d/o (r/o MDD recurrent severe with psychotic features, r/o schizoaffective d/o, r/o psychotic d/o secondary to general medical condition)  -- Continue Celexa 3m daily  -- Continue Zyprexa 11mpo qhs - the r/b/se/a to an atypical antipsychotic including risk of developing TD/EPS, weight gain, DM and high cholesterol were discussed with the patient and she consents to continued medication use  -- Metabolic profile and EKG monitoring obtained while on an atypical antipsychotic (BMI: 35.23; Lipid Panel:cholesterol 259, HDL 69, LDL 161, triglycerides 145; HbgA1c: 5.6 QTc:452)   --  Continue Vistaril 2532mid PRN anxiety  -- Continue Trazodone 48m51m qhs PRN insomnia  -- In context of new onset AH will order ANA, RPR, HIV, B12, heavy metal, ESR, CRP, and ceruloplasmin as a medical w/u to r/o an organic etiology to her symptoms (Noncontrast Head CT on 10/03/20 was negative; TSH 2.515, WBC 5.8, H/H 13.9/44.0, platelets 240, alcohol <10, UDS negative)  -- Encouraged patient to participate in unit milieu and in scheduled group therapies   -- Short Term Goals: Ability to identify changes in lifestyle to reduce recurrence of condition will improve  -- Long Term Goals: Improvement in symptoms so as ready for discharge    3. Medical Issues Being Addressed:   Hypokalemia (K+ 3.3) and Hyponatremia (Na+ 132)  -- Encouraging po intake and will recheck BMP tomorrow   Hyperlipidemia (total cholesterol 259 with LDL 161)  -- encouraged low fat, high fiber diet and increased exercise with need to see PCP as an outpatient for management   COVID  -- Continue quarantine and symptomatic monitoring - currently afebrile with sats  100%  4. Discharge Planning:   -- Social work and case management to assist with discharge planning and identification of hospital follow-up needs prior to discharge  -- Estimated LOS: TBD  -- Discharge Concerns: Need to establish a safety plan; Medication compliance and effectiveness  -- Discharge Goals: Return home with outpatient referrals for mental health follow-up including medication management/psychotherapy  Harlow Asa, MD, FAPA 10/07/2020, 11:52 AM

## 2020-10-07 NOTE — Progress Notes (Signed)
Adult Psychoeducational Group Note  Date:  10/07/2020 Time:  9:47 PM  Group Topic/Focus:  Wrap-Up Group:   The focus of this group is to help patients review their daily goal of treatment and discuss progress on daily workbooks.  Participation Level:  Active  Participation Quality:  Appropriate  Affect:  Appropriate  Cognitive:  Appropriate  Insight: Appropriate  Engagement in Group:  Engaged  Modes of Intervention:  Discussion  Additional Comments:  Explain to pt wrap up group pt said her day was 10. The one positive thing that happen she as able to talk to people and engage conversation.  Charna Busman Long 10/07/2020, 9:47 PM

## 2020-10-08 LAB — BASIC METABOLIC PANEL
Anion gap: 9 (ref 5–15)
BUN: 13 mg/dL (ref 6–20)
CO2: 23 mmol/L (ref 22–32)
Calcium: 9.2 mg/dL (ref 8.9–10.3)
Chloride: 108 mmol/L (ref 98–111)
Creatinine, Ser: 0.83 mg/dL (ref 0.44–1.00)
GFR, Estimated: >60 mL/min (ref >60)
Glucose, Bld: 118 mg/dL — ABNORMAL HIGH (ref 70–99)
Potassium: 4 mmol/L (ref 3.5–5.1)
Sodium: 140 mmol/L (ref 135–145)

## 2020-10-08 LAB — VITAMIN B12: Vitamin B-12: 574 pg/mL (ref 180–914)

## 2020-10-08 LAB — RPR: RPR Ser Ql: NONREACTIVE

## 2020-10-08 LAB — HIV ANTIBODY (ROUTINE TESTING W REFLEX): HIV Screen 4th Generation wRfx: NONREACTIVE

## 2020-10-08 LAB — C-REACTIVE PROTEIN: CRP: 0.7 mg/dL (ref <1.0)

## 2020-10-08 LAB — SEDIMENTATION RATE: Sed Rate: 7 mm/hr (ref 0–22)

## 2020-10-08 MED ORDER — FLUTICASONE PROPIONATE 50 MCG/ACT NA SUSP
1.0000 | Freq: Every day | NASAL | Status: DC | PRN
  Administered 2020-10-08 – 2020-10-11 (×2): 1 via NASAL
  Filled 2020-10-08: qty 16

## 2020-10-08 MED ORDER — TRAZODONE HCL 50 MG PO TABS
50.0000 mg | ORAL_TABLET | Freq: Every evening | ORAL | Status: DC | PRN
  Administered 2020-10-09 (×2): 50 mg via ORAL
  Filled 2020-10-08 (×2): qty 1

## 2020-10-08 MED ORDER — ALBUTEROL SULFATE HFA 108 (90 BASE) MCG/ACT IN AERS
2.0000 | INHALATION_SPRAY | Freq: Four times a day (QID) | RESPIRATORY_TRACT | Status: DC | PRN
  Administered 2020-10-08: 2 via RESPIRATORY_TRACT
  Filled 2020-10-08: qty 6.7

## 2020-10-08 NOTE — Progress Notes (Signed)
Pt denies SI/HI and VH. Pt endorses AH of voices threatening to kill her and people she knows. Pt said that she has had conversations with these voices before and that they may be through frequency waves. She said that the voices do quiet down "on and off." She also has tactile hallucinations where she feels like someone is on top of her or there is the presence of someone else. Pt said it is as if someone is "on me or with me." She continues to have a somatic delusion that there may be some type of electrical current running through her body. Pt said that she has a rough time getting herself going in the mornings. Her goal is to get her life together. Encouraged pt to write in her blue journal to distract herself from the voices and to also spend as much as time as she can out of her room. Encouraged pt to attend groups and be more interactive with other peers in the dayroom. She did spend some time earlier tonight watching television in the dayroom. Active listening, reassurance, and support provided. Medications administered as ordered by MD. Q 15 min safety checks continue. Pt's safety has been maintained.   10/07/20 2125  Psych Admission Type (Psych Patients Only)  Admission Status Voluntary  Psychosocial Assessment  Patient Complaints Anxiety;Depression;Sadness;Worrying  Eye Contact Fair  Facial Expression Animated;Anxious  Affect Anxious;Appropriate to circumstance  Speech Logical/coherent  Interaction Assertive  Motor Activity Other (Comment) (WDL)  Appearance/Hygiene Unremarkable  Behavior Characteristics Cooperative;Appropriate to situation;Anxious  Mood Depressed;Anxious;Preoccupied;Pleasant  Thought Process  Coherency WDL  Content Delusions;Paranoia  Delusions Persecutory;Somatic;Paranoid  Perception Hallucinations  Hallucination Auditory;Tactile  Judgment Poor  Confusion None  Danger to Self  Current suicidal ideation? Denies  Danger to Others  Danger to Others None reported  or observed

## 2020-10-08 NOTE — Progress Notes (Signed)
Pt still endorses AH of voices talking to her about "everything." Pt was asked to elaborate and said that they talk about how there day went. This includes there work day, what they did, and activities like if they fixed something. She said that they voices are low tonight. She denies VH. Pt does have tactile hallucinations still where she feels like there is someone on her making her feel heavy. She also feels like there is extra energy in her body. She rates her anxiety a 5 on a scale of 0-10 tonight (10 being the greatest). Pt was administered her PRN vistaril and it helped decrease her anxiety. Pt was present in the dayroom earlier tonight and was interactive with staff. Pt is still paranoid and currently having a hard time falling asleep. Pt told the tech that she thinks the water in her bathroom smells weird. Nira Conn, NP notified and approved request for a repeat of 50 mg of trazodone. Pt denies SI/HI. Active listening, reassurance, and support provided. Medications administered as ordered by provider. Q 15 min safety checks continue. Pt's safety has been maintained.   10/08/20 2118  Psych Admission Type (Psych Patients Only)  Admission Status Voluntary  Psychosocial Assessment  Patient Complaints Anxiety;Worrying  Eye Contact Fair  Facial Expression Anxious;Pensive;Worried;Animated  Affect Anxious;Preoccupied;Appropriate to circumstance  Speech Logical/coherent  Interaction Assertive  Motor Activity Fidgety  Appearance/Hygiene In scrubs;Unremarkable  Behavior Characteristics Cooperative;Appropriate to situation;Anxious  Mood Anxious;Suspicious;Preoccupied;Pleasant  Thought Process  Coherency WDL  Content Delusions;Paranoia  Delusions Paranoid;Persecutory;Somatic  Perception Hallucinations  Hallucination Auditory;Tactile  Judgment Poor  Confusion None  Danger to Self  Current suicidal ideation? Denies  Danger to Others  Danger to Others None reported or observed

## 2020-10-08 NOTE — Progress Notes (Signed)
Progress note  Pt still complains of tactile hallucinations with something "pushing" on them    10/08/20 0750  Psych Admission Type (Psych Patients Only)  Admission Status Voluntary  Psychosocial Assessment  Patient Complaints Anxiety  Eye Contact Fair  Facial Expression Anxious  Affect Anxious;Preoccupied  Speech Logical/coherent  Interaction Assertive  Motor Activity Fidgety  Appearance/Hygiene Unremarkable  Behavior Characteristics Cooperative;Appropriate to situation;Anxious  Mood Anxious;Preoccupied;Pleasant  Thought Process  Coherency WDL  Content Delusions;Paranoia  Delusions Paranoid;Somatic  Perception Hallucinations  Hallucination Auditory;Tactile  Judgment Poor  Confusion None  Danger to Self  Current suicidal ideation? Denies  Danger to Others  Danger to Others None reported or observed

## 2020-10-08 NOTE — Progress Notes (Addendum)
[facility] MD Progress Note  10/08/2020 7:54 AM Amy Rowe  MRN:  [id number]   Chief Complaint: auditory hallucinations  Subjective: ZABDI MIS is a 45 y.o. female with a history of MDD, who was initially admitted for inpatient psychiatric hospitalization on 10/05/2020 for management of command AH telling her not to eat and to kill herself. The patient is currently on Hospital Day 3.   Chart Review from last 24 hours:  The patient's chart was reviewed and nursing notes were reviewed. The patient's case was discussed in multidisciplinary team meeting. Per review of nursing notes, the patient continues to endorse somatic delusions of an electrical current in her body, TH of feeling someone touch her, and AH coming through frequency waves. She reported to staff that the [facility] are lessening. Per MAR she has been compliant with scheduled medications and required Vistaril X1 yesterday for anxiety and X1 this morning for anxiety. She required Trazodone X1 for sleep.She has had sats 100% on RA and has remained afebrile.  Information Obtained Today During Patient Interview: The patient was seen and evaluated on the unit. She states the [facility] are less frequent and less intense but still "come and go." She describes hearing what sounds like a "CB channel" playing in her head where she overhears multiple talking to each other and commenting on her behaviors. She states that sometimes the voices are "mean" and try to tell her to do things but she is able to resist acting on command AH. She continues to believe that these internal voices are trying to manipulate her behaviors and thoughts. She describes a sense of an internal frequency or current in her body. She denies ideas of reference or VH. She denies thought broadcasting. She states her mood is a 2/10 if 1 is the best mood and denies anxiety or current feelings of depression. She reports stable sleep and appetite. She denies COVID symptoms other than mild nasal  congestion. She denies medication side-effects.   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 The direct patient care time included face-to-face time with the patient, reviewing the patient's chart, communicating with other professionals, and coordinating care. Greater than 50% of this time was spent in counseling or coordinating care with the patient regarding goals of hospitalization, psycho-education, and discharge planning needs.  Past Psychiatric History: see admission H&P  Past Medical History:  Past Medical History:  Diagnosis Date  . Anxiety   . Asthma   . Bronchitis   . Depression     Past Surgical History:  Procedure Laterality Date  . CESAREAN SECTION     Family History: History reviewed. No pertinent family history.   Family Psychiatric  History: see admission H&P  Social History:  Social History   Substance and Sexual Activity  Alcohol Use Yes   Comment: socially     Social History   Substance and Sexual Activity  Drug Use No    Social History   Socioeconomic History  . Marital status: Divorced    Spouse name: Not on file  . Number of children: Not on file  . Years of education: Not on file  . Highest education level: Not on file  Occupational History  . Not on file  Tobacco Use  .  Smoking status: Current Every Day Smoker    Types: Cigarettes  . Smokeless tobacco: Never Used  Substance and Sexual Activity  . Alcohol use: Yes    Comment: socially  . Drug use: No  . Sexual activity: Not on file  Other Topics Concern  . Not on file  Social History Narrative  . Not on file   Social Determinants of Health   Financial Resource Strain: Not on file  Food  Insecurity: Not on file  Transportation Needs: Not on file  Physical Activity: Not on file  Stress: Not on file  Social Connections: Not on file   Sleep: Good per patient report  Appetite:  Good per patient report  Current Medications: Current Facility-Administered Medications  Medication Dose Route Frequency Provider Last Rate Last Admin  . acetaminophen (TYLENOL) tablet 650 mg  650 mg Oral Q6H PRN Merlyn Lot E, NP   650 mg at 10/07/20 0802  . alum & mag hydroxide-simeth (MAALOX/MYLANTA) 200-200-20 MG/5ML suspension 30 mL  30 mL Oral Q4H PRN Caroline Sauger, NP      . citalopram (CELEXA) tablet 20 mg  20 mg Oral Daily Nwoko, Herbert Pun I, NP   20 mg at 10/08/20 0746  . hydrOXYzine (ATARAX/VISTARIL) tablet 25 mg  25 mg Oral TID PRN Mallie Darting, NP   25 mg at 10/08/20 0748  . magnesium hydroxide (MILK OF MAGNESIA) suspension 30 mL  30 mL Oral Daily PRN Merlyn Lot E, NP      . OLANZapine (ZYPREXA) tablet 10 mg  10 mg Oral QHS Sharma Covert, MD   10 mg at 10/07/20 2114  . traZODone (DESYREL) tablet 50 mg  50 mg Oral QHS PRN Mallie Darting, NP   50 mg at 10/07/20 2114   Lab Results:  Results for orders placed or performed during the hospital encounter of 10/05/20 (from the past 48 hour(s))  Hemoglobin A1c     Status: None   Collection Time: 10/06/20  5:45 PM  Result Value Ref Range   Hgb A1c MFr Bld 5.6 4.8 - 5.6 %    Comment: (NOTE) Pre diabetes:          5.7%-6.4%  Diabetes:              >6.4%  Glycemic control for   <7.0% adults with diabetes    Mean Plasma Glucose 114.02 mg/dL    Comment: Performed at [facility], [place] [address] [place], [place] [address]  Lipid panel     Status: Abnormal   Collection Time: 10/06/20  5:45 PM  Result Value Ref Range   Cholesterol 259 (H) 0 - 200 mg/dL   Triglycerides 145 <150 mg/dL   HDL 69 >40 mg/dL   Total CHOL/HDL Ratio 3.8 RATIO   VLDL 29 0 - 40 mg/dL   LDL Cholesterol 161 (H) 0 - 99 mg/dL    Comment:         Total Cholesterol/HDL:CHD Risk Coronary Heart Disease Risk Table                     Men   Women  1/2 Average Risk   3.4   3.3  Average Risk       5.0   4.4  2 X Average Risk   9.6   7.1  3 X Average Risk  23.4   11.0        Use the calculated Patient Ratio above and the CHD Risk Table  to determine the patient's CHD Risk.        ATP III CLASSIFICATION (LDL):  <100     mg/dL   Optimal  100-129  mg/dL   Near or Above                    Optimal  130-159  mg/dL   Borderline  160-189  mg/dL   High  >190     mg/dL   Very High Performed at [facility] [address], [place], [place] [address]   TSH     Status: None   Collection Time: 10/06/20  5:45 PM  Result Value Ref Range   TSH 2.515 0.350 - 4.500 uIU/mL    Comment: Performed by a 3rd Generation assay with a functional sensitivity of <=0.01 uIU/mL. Performed at [facility], [place] [address], [place], [place] [address]     Blood Alcohol level:  Lab Results  Component Value Date   ETH <10 10/03/2020   ETH 70 (H) 67/61/9509    Metabolic Disorder Labs: Lab Results  Component Value Date   HGBA1C 5.6 10/06/2020   MPG 114.02 10/06/2020   MPG 128 08/14/2015   No results found for: PROLACTIN Lab Results  Component Value Date   CHOL 259 (H) 10/06/2020   TRIG 145 10/06/2020   HDL 69 10/06/2020   CHOLHDL 3.8 10/06/2020   VLDL 29 10/06/2020   LDLCALC 161 (H) 10/06/2020   LDLCALC 138 (H) 08/14/2015   Physical Findings: AIMS: Facial and Oral Movements Muscles of Facial Expression: None, normal Lips and Perioral Area: None, normal Jaw: None, normal Tongue: None, normal,Extremity Movements Upper (arms, wrists, hands, fingers): None, normal Lower (legs, knees, ankles, toes): None, normal, Trunk Movements Neck, shoulders, hips: None, normal, Overall Severity Severity of abnormal movements (highest score from questions above): None, normal Incapacitation due to abnormal movements:  None, normal Patient's awareness of abnormal movements (rate only patient's report): No Awareness, Dental Status Current problems with teeth and/or dentures?: No Does patient usually wear dentures?: No   Musculoskeletal: Strength & Muscle Tone: within normal limits Gait & Station: normal , steady Patient leans: N/A  Psychiatric Specialty Exam: Physical Exam Vitals reviewed.  HENT:     Head: Normocephalic.  Pulmonary:     Effort: Pulmonary effort is normal.  Neurological:     Mental Status: She is alert.     Review of Systems  Constitutional: Negative for fever.  HENT: Positive for congestion.   Respiratory: Negative for shortness of breath.   Cardiovascular: Negative for chest pain.  Gastrointestinal: Negative for diarrhea, nausea and vomiting.  Neurological: Negative for headaches.    Blood pressure 108/79, pulse 68, temperature 98 F (36.7 C), temperature source Oral, resp. rate 20, height 5' 3"  (1.6 m), weight 90.2 kg, SpO2 100 %.Body mass index is 35.23 kg/m.  General Appearance: good hygiene, appears stated age, well engaged with examiner  Eye Contact:  Good  Speech:  Clear and Coherent and Normal Rate  Volume:  Normal  Mood:  Described as "2/10" - appears less anxious  Affect:  Calmer appearing  Thought Process:  Goal Directed  Orientation:  Full (Time, Place, and Person)  Thought Content:  Endorses residual AH including command AH and somatic delusions that she has a frequency or electrical current running through her body; has belief that [facility] are attempting to manipulate her thoughts and actions; denies ideas of reference; is not grossly responding to internal/external stimuli on exam  Suicidal Thoughts:  Denied  Homicidal Thoughts:  Denied  Memory:  Recent;   Fair  Judgement:  Fair  Insight:  Fair  Psychomotor Activity:  Normal; AIMs 0 on exam today, no cogwheeling or tremors  Concentration:  Concentration: Fair and Attention Span: Fair  Recall:  [employer] of  Knowledge:  Fair  Language:  Good  Akathisia:  Negative  Assets:  Desire for Improvement Resilience Social Support  ADL's:  Intact  Cognition:  WNL  Sleep:  Number of Hours: 6.75   Treatment Plan Summary:  ASSESSMENT: Diagnoses / Active Problems: Unspecified schizophrenia spectrum and other psychotic d/o (r/o MDD recurrent severe with psychotic features, r/o schizoaffective d/o, r/o psychotic d/o secondary to general medical condition)  PLAN: 1. Safety and Monitoring:  -- Voluntary admission to inpatient psychiatric unit for safety, stabilization and treatment  -- Daily contact with patient to assess and evaluate symptoms and progress in treatment  -- Patient's case to be discussed in multi-disciplinary team meeting  -- Observation Level : q15 minute checks  -- Vital signs:  q12 hours  -- Precautions: suicide and COVID  2. Psychiatric Diagnoses and Treatment:   Unspecified schizophrenia spectrum and other psychotic d/o (r/o MDD recurrent severe with psychotic features, r/o schizoaffective d/o, r/o psychotic d/o secondary to general medical condition)  -- Continue Celexa 57m daily   -- Increase Zyprexa to 157mpo qhs due to residual somatic delusions and AH  -- Metabolic profile and EKG monitoring obtained while on an atypical antipsychotic (BMI: 35.23; Lipid Panel:cholesterol 259, HDL 69, LDL 161, triglycerides 145; HbgA1c: 5.6 QTc:452)   -- Continue Vistaril 256mid PRN anxiety  -- Continue Trazodone 57m54m qhs PRN insomnia  -- In context of new onset AH will order medical w/u to r/o an organic etiology to her symptoms (Noncontrast Head CT on 10/03/20 was negative; TSH 2.515, WBC 5.8, H/H 13.9/44.0, platelets 240, alcohol <10, UDS negative; CRP 0.7, B12 574, ESR 7, HIV nonreactive, RPR nonreactive) ANA, heavy metal, and ceruloplasmin pending  -- Encouraged patient to participate in unit milieu and in scheduled group therapies   -- Short Term Goals: Ability to identify changes in  lifestyle to reduce recurrence of condition will improve  -- Long Term Goals: Improvement in symptoms so as ready for discharge    3. Medical Issues Being Addressed:   Hypokalemia (K+ 3.3) and Hyponatremia (Na+ 132)- resolved  -- Encouraging po intake and repeat BMP on 10/08/20 shows K+ 4.0 and Na+ 140   Hyperlipidemia (total cholesterol 259 with LDL 161)  -- encouraged low fat, high fiber diet and increased exercise with need to see PCP as an outpatient for management   COVID  -- Continue quarantine and symptomatic monitoring - currently afebrile with sats 100%  -- Flonase PRN for congestion and Albuterol MDI PRN for wheezing given reported h/o asthma  4. Discharge Planning:   -- Social work and case management to assist with discharge planning and identification of hospital follow-up needs prior to discharge  -- Estimated LOS: TBD  -- Discharge Concerns: Need to establish a safety plan; Medication compliance and effectiveness  -- Discharge Goals: Return home with outpatient referrals for mental health follow-up including medication management/psychotherapy  Jaylise Peek Harlow Asa, FAPA 10/08/2020, 7:54 AM

## 2020-10-08 NOTE — Plan of Care (Signed)
  Problem: Activity: Goal: Interest or engagement in activities will improve Outcome: Progressing   Problem: Coping: Goal: Ability to verbalize frustrations and anger appropriately will improve Outcome: Progressing   Problem: Health Behavior/Discharge Planning: Goal: Identification of resources available to assist in meeting health care needs will improve Outcome: Progressing   Problem: Physical Regulation: Goal: Ability to maintain clinical measurements within normal limits will improve Outcome: Progressing

## 2020-10-08 NOTE — [facility] Group Notes (Signed)
Type of Therapy:  Group Therapy   Modes of Intervention:     Due to the COVID-19 pandemic, this group has been supplemented with worksheets.   Summary of Progress/Problems: CSW provided worksheet to patient due to COVID. CSW offered to meet individually with patient as needed  Erin Dickerson, LCSWA Clinicial Social Worker [facility] 

## 2020-10-09 LAB — HEAVY METALS, BLOOD
Arsenic: 2 ug/L (ref 2–23)
Lead: <1 ug/dL (ref 0–4)
Mercury: <1 ug/L (ref 0.0–14.9)

## 2020-10-09 LAB — CERULOPLASMIN: Ceruloplasmin: 25 mg/dL (ref 19.0–39.0)

## 2020-10-09 LAB — ANA: Anti Nuclear Antibody (ANA): NEGATIVE

## 2020-10-09 MED ORDER — BENZTROPINE MESYLATE 0.5 MG PO TABS
0.5000 mg | ORAL_TABLET | Freq: Two times a day (BID) | ORAL | Status: DC
  Administered 2020-10-09 – 2020-10-11 (×6): 0.5 mg via ORAL
  Filled 2020-10-09 (×12): qty 1

## 2020-10-09 MED ORDER — OLANZAPINE 5 MG PO TABS
5.0000 mg | ORAL_TABLET | Freq: Every day | ORAL | Status: DC
  Administered 2020-10-09 – 2020-10-11 (×3): 5 mg via ORAL
  Filled 2020-10-09 (×7): qty 1

## 2020-10-09 NOTE — Progress Notes (Signed)
   10/09/20 1100  Psych Admission Type (Psych Patients Only)  Admission Status Voluntary  Psychosocial Assessment  Patient Complaints Anxiety  Eye Contact Fair  Facial Expression Anxious;Pensive;Worried;Animated  Affect Anxious;Preoccupied;Appropriate to circumstance  Speech Logical/coherent  Interaction Assertive  Motor Activity Fidgety  Appearance/Hygiene In scrubs;Unremarkable  Behavior Characteristics Cooperative  Mood Preoccupied  Thought Process  Coherency WDL  Content Delusions;Paranoia  Delusions Paranoid;Persecutory;Somatic  Perception Hallucinations  Hallucination Auditory;Tactile  Judgment Poor  Confusion None  Danger to Self  Current suicidal ideation? Denies  Danger to Others  Danger to Others None reported or observed  Dar Note:Patient visible in milieu watching TV and interacting with peers/staff.  Denies suicidal  thoughts, auditory and visual hallucinations.  Medication given as prescribed.  Routine safety checks maintained.  Patient is safe on the unit.  No acute distress noted.

## 2020-10-09 NOTE — Progress Notes (Addendum)
[facility] MD Progress Note  10/09/2020 7:55 AM Amy Rowe  MRN:  [id number]   Chief Complaint: auditory hallucinations  Subjective: Amy Rowe is a 45 y.o. female with a history of MDD, who was initially admitted for inpatient psychiatric hospitalization on 10/05/2020 for management of command AH telling her not to eat and to kill herself. The patient is currently on Hospital Day 4.   Chart Review from last 24 hours:  The patient's chart was reviewed and nursing notes were reviewed. The patient's case was discussed in multidisciplinary team meeting.Per nursing notes she has reported residual AH and TH and was paranoid overnight that the water in her room smelled strange. Per [facility] she has been compliant with scheduled medications. She received Vistaril X2 yesterday for anxiety and Trazodone 85m and a repeat 552movernight for sleep. She has been afebrile with sats 99-100%.  Information Obtained Today During Patient Interview: The patient was seen and evaluated on the unit. She reports ongoing AH of commentary between various voices inside her head that occasionally make "mean" statements about her. She states the voices are decreasing in frequency and intensity compared to previous days. She denies VH, ideas of reference, or thought broadcasting. She believes the voices are trying to control her actions with "electrical impulses" but she believes she is in control of her own thoughts. She denies SI or HI. She describes feeling a sensation of an electrical current in her body which she states is stronger and weaker at times depending on who she is standing near. She denies feeling depressed unless the "voices make mean comments." She denies issues with anxiety other than ruminations about what could have caused her to develop AH. She questions this examiner if she could have been exposed to fumes or chemicals that caused the onset of her AH and supportive reassurance was provided.  She reports good  sleep and appetite and voices no physical complaints other than mild congestion. She denies medication side-effects.   Principal Problem: Schizophrenia spectrum disorder with psychotic disorder type not yet determined (HCGl[place]aleDiagnosis: Principal Problem:   Schizophrenia spectrum disorder with psychotic disorder type not yet determined (HCOrient Total Time Spent in Direct Patient Care:  I personally spent 30 minutes on the unit in direct patient care. The direct patient care time included face-to-face time with the patient, reviewing the patient's chart, communicating with other professionals, and coordinating care. Greater than 50% of this time was spent in counseling or coordinating care with the patient regarding goals of hospitalization, psycho-education, and discharge planning needs.  Past Psychiatric History: see admission H&P  Past Medical History:  Past Medical History:  Diagnosis Date  . Anxiety   . Asthma   . Bronchitis   . Depression     Past Surgical History:  Procedure Laterality Date  . CESAREAN SECTION     Family History: History reviewed. No pertinent family history.   Family Psychiatric  History: see admission H&P  Social History:  Social History   Substance and Sexual Activity  Alcohol Use Yes   Comment: socially     Social History   Substance and Sexual Activity  Drug Use No    Social History   Socioeconomic History  . Marital status: Divorced    Spouse name: Not on file  . Number of children: Not on file  . Years of education: Not on file  . Highest education level: Not on file  Occupational History  . Not on file  Tobacco Use  .  Smoking status: Current Every Day Smoker    Types: Cigarettes  . Smokeless tobacco: Never Used  Substance and Sexual Activity  . Alcohol use: Yes    Comment: socially  . Drug use: No  . Sexual activity: Not on file  Other Topics Concern  . Not on file  Social History Narrative  . Not on file   Social Determinants  of Health   Financial Resource Strain: Not on file  Food Insecurity: Not on file  Transportation Needs: Not on file  Physical Activity: Not on file  Stress: Not on file  Social Connections: Not on file   Sleep: Good per patient report  Appetite:  Good per patient report  Current Medications: Current Facility-Administered Medications  Medication Dose Route Frequency Provider Last Rate Last Admin  . acetaminophen (TYLENOL) tablet 650 mg  650 mg Oral Q6H PRN Merlyn Lot E, NP   650 mg at 10/07/20 0802  . albuterol (VENTOLIN HFA) 108 (90 Base) MCG/ACT inhaler 2 puff  2 puff Inhalation Q6H PRN Harlow Asa, MD   2 puff at 10/08/20 2120  . alum & mag hydroxide-simeth (MAALOX/MYLANTA) 200-200-20 MG/5ML suspension 30 mL  30 mL Oral Q4H PRN Caroline Sauger, NP      . citalopram (CELEXA) tablet 20 mg  20 mg Oral Daily Lindell Spar I, NP   20 mg at 10/08/20 0746  . fluticasone (FLONASE) 50 MCG/ACT nasal spray 1 spray  1 spray Each Nare Daily PRN Harlow Asa, MD   1 spray at 10/08/20 2118  . hydrOXYzine (ATARAX/VISTARIL) tablet 25 mg  25 mg Oral TID PRN Mallie Darting, NP   25 mg at 10/08/20 2116  . magnesium hydroxide (MILK OF MAGNESIA) suspension 30 mL  30 mL Oral Daily PRN Merlyn Lot E, NP      . OLANZapine (ZYPREXA) tablet 10 mg  10 mg Oral QHS Sharma Covert, MD   10 mg at 10/08/20 2116  . traZODone (DESYREL) tablet 50 mg  50 mg Oral QHS PRN Rozetta Nunnery, NP   50 mg at 10/09/20 0005   Lab Results:  Results for orders placed or performed during the hospital encounter of 10/05/20 (from the past 48 hour(s))  RPR     Status: None   Collection Time: 10/08/20  7:35 AM  Result Value Ref Range   RPR Ser Ql NON REACTIVE NON REACTIVE    Comment: Performed at [facility], [address], [place], [place] [address]  Sedimentation rate     Status: None   Collection Time: 10/08/20  7:35 AM  Result Value Ref Range   Sed Rate 7 0 - 22 mm/hr    Comment: Performed at  [facility], [place] [address], [place], [place] [address]  HIV Antibody (routine testing w rflx)     Status: None   Collection Time: 10/08/20  7:35 AM  Result Value Ref Range   HIV Screen 4th Generation wRfx Non Reactive Non Reactive    Comment: Performed at [facility], [place] [address] [place], [place] [address]  Basic metabolic panel     Status: Abnormal   Collection Time: 10/08/20  7:35 AM  Result Value Ref Range   Sodium 140 135 - 145 mmol/L   Potassium 4.0 3.5 - 5.1 mmol/L   Chloride 108 98 - 111 mmol/L   CO2 23 22 - 32 mmol/L   Glucose, Bld 118 (H) 70 - 99 mg/dL  Comment: Glucose reference range applies only to samples taken after fasting for at least 8 hours.   BUN 13 6 - 20 mg/dL   Creatinine, Ser 0.83 0.44 - 1.00 mg/dL   Calcium 9.2 8.9 - 10.3 mg/dL   GFR, Estimated >60 >60 mL/min    Comment: (NOTE) Calculated using the CKD-EPI Creatinine Equation (2021)    Anion gap 9 5 - 15    Comment: Performed at [facility], [place] [address], [place], [place] [address]  C-reactive protein     Status: None   Collection Time: 10/08/20  7:36 AM  Result Value Ref Range   CRP 0.7 <1.0 mg/dL    Comment: Performed at [facility], [place] [address], [place], [place] [address]  Vitamin B12     Status: None   Collection Time: 10/08/20  7:36 AM  Result Value Ref Range   Vitamin B-12 574 180 - 914 pg/mL    Comment: (NOTE) This assay is not validated for testing neonatal or myeloproliferative syndrome specimens for Vitamin B12 levels. Performed at [facility], [place] [address], [place], [place] [address]     Blood Alcohol level:  Lab Results  Component Value Date   ETH <10 10/03/2020   ETH 70 (H) 27/11/5007    Metabolic Disorder Labs: Lab Results  Component Value Date   HGBA1C 5.6 10/06/2020   MPG 114.02 10/06/2020   MPG 128 08/14/2015   No results found for: PROLACTIN Lab Results  Component Value  Date   CHOL 259 (H) 10/06/2020   TRIG 145 10/06/2020   HDL 69 10/06/2020   CHOLHDL 3.8 10/06/2020   VLDL 29 10/06/2020   LDLCALC 161 (H) 10/06/2020   LDLCALC 138 (H) 08/14/2015   Physical Findings: AIMS: Facial and Oral Movements Muscles of Facial Expression: None, normal Lips and Perioral Area: None, normal Jaw: None, normal Tongue: None, normal,Extremity Movements Upper (arms, wrists, hands, fingers): None, normal Lower (legs, knees, ankles, toes): None, normal, Trunk Movements Neck, shoulders, hips: None, normal, Overall Severity Severity of abnormal movements (highest score from questions above): None, normal Incapacitation due to abnormal movements: None, normal Patient's awareness of abnormal movements (rate only patient's report): No Awareness, Dental Status Current problems with teeth and/or dentures?: No Does patient usually wear dentures?: No   Musculoskeletal: Strength & Muscle Tone: within normal limits Gait & Station: normal , steady Patient leans: N/A  Psychiatric Specialty Exam: Physical Exam Vitals reviewed.  HENT:     Head: Normocephalic.  Pulmonary:     Effort: Pulmonary effort is normal.  Neurological:     Mental Status: She is alert.     Review of Systems  Constitutional: Negative for fever.  HENT: Positive for congestion.   Respiratory: Negative for shortness of breath.   Cardiovascular: Negative for chest pain.    Blood pressure 112/74, pulse 95, temperature 98 F (36.7 C), temperature source Oral, resp. rate 18, height 5' 3"  (1.6 m), weight 90.2 kg, SpO2 99 %.Body mass index is 35.23 kg/m.  General Appearance: good hygiene, appears stated age, well engaged with examiner  Eye Contact:  Good  Speech:  Clear and Coherent and Normal Rate  Volume:  Normal  Mood:  Described as "good" - appears mildly anxious  Affect: mildly anxious  Thought Process:  Goal Directed but ruminative   Orientation:  oriented to president, city, year, and month   Thought Content:  Endorses residual AH of multiple internal voices and somatic delusions that she has  a frequency or electrical current running through her body; has belief that [place] are attempting to manipulate her actions; denies ideas of reference; is not grossly responding to internal/external stimuli on exam  Suicidal Thoughts:  Denied  Homicidal Thoughts:  Denied  Memory:  Recent;   Fair  Judgement:  Fair  Insight:  Fair  Psychomotor Activity:  Normal  Concentration:  Concentration: Fair and Attention Span: Fair  Recall:  [employer] of Knowledge:  Fair  Language:  Good  Akathisia:  Negative  Assets:  Desire for Improvement Resilience Social Support  ADL's:  Intact  Cognition:  WNL  Sleep:  Number of Hours: 6   Treatment Plan Summary:  ASSESSMENT: Diagnoses / Active Problems: Unspecified schizophrenia spectrum and other psychotic d/o (r/o MDD recurrent severe with psychotic features, r/o schizoaffective d/o, r/o psychotic d/o secondary to general medical condition)  PLAN: 1. Safety and Monitoring:  -- Voluntary admission to inpatient psychiatric unit for safety, stabilization and treatment  -- Daily contact with patient to assess and evaluate symptoms and progress in treatment  -- Patient's case to be discussed in multi-disciplinary team meeting  -- Observation Level : q15 minute checks  -- Vital signs:  q12 hours  -- Precautions: suicide and COVID  2. Psychiatric Diagnoses and Treatment:   Unspecified schizophrenia spectrum and other psychotic d/o (r/o MDD recurrent severe with psychotic features, r/o schizoaffective d/o, r/o psychotic d/o secondary to general medical condition)  -- Continue Celexa 73m daily   -- It appears that the patient received Zyprexa 19mpo qhs overnight rather than 1560mo will give 5mg66mw and continue 10mg21m tonight for residual psychosis  -- Start Cogentin 0.5mg b46mgiven dose increase in Zyprexa  -- Metabolic profile and EKG monitoring  obtained while on an atypical antipsychotic (BMI: 35.23; Lipid Panel:cholesterol 259, HDL 69, LDL 161, triglycerides 145; HbgA1c: 5.6 QTc:452)   -- Continue Vistaril 25mg t38mRN anxiety  -- Increase Trazodone to 100mg po88m PRN insomnia  -- In context of new onset AH will order medical w/u to r/o an organic etiology to her symptoms (Noncontrast Head CT on 10/03/20 was negative; ceruloplasmin 25; TSH 2.515, WBC 5.8, H/H 13.9/44.0, platelets 240, alcohol <10, UDS negative; CRP 0.7, B12 574, ESR 7, HIV nonreactive, RPR nonreactive) ANA and heavy metal pending  -- Encouraged patient to participate in unit milieu and in scheduled group therapies   -- Short Term Goals: Ability to identify changes in lifestyle to reduce recurrence of condition will improve  -- Long Term Goals: Improvement in symptoms so as ready for discharge    3. Medical Issues Being Addressed:   Hypokalemia (K+ 3.3) and Hyponatremia (Na+ 132)- resolved  -- Encouraging po intake and repeat BMP on 10/08/20 shows K+ 4.0 and Na+ 140   Hyperlipidemia (total cholesterol 259 with LDL 161)  -- encouraged low fat, high fiber diet and increased exercise with need to see PCP as an outpatient for management   COVID  -- Continue quarantine and symptomatic monitoring - currently afebrile with sats 99-100%  -- Flonase PRN for congestion and Albuterol MDI PRN for wheezing given reported h/o asthma  4. Discharge Planning:   -- Social work and case management to assist with discharge planning and identification of hospital follow-up needs prior to discharge  -- Estimated LOS: TBD  -- Discharge Concerns: Need to establish a safety plan; Medication compliance and effectiveness  -- Discharge Goals: Return home with outpatient referrals for mental health follow-up including medication  management/psychotherapy  Harlow Asa, MD, FAPA 10/09/2020, 7:55 AM

## 2020-10-10 MED ORDER — TRAZODONE HCL 100 MG PO TABS
100.0000 mg | ORAL_TABLET | Freq: Every evening | ORAL | Status: DC | PRN
  Administered 2020-10-10 – 2020-10-12 (×3): 100 mg via ORAL
  Filled 2020-10-10 (×3): qty 1

## 2020-10-10 NOTE — Progress Notes (Signed)
   10/10/20 0900  Psych Admission Type (Psych Patients Only)  Admission Status Voluntary  Psychosocial Assessment  Patient Complaints Depression  Eye Contact Fair  Facial Expression Anxious;Pensive;Worried;Animated  Affect Anxious;Preoccupied;Appropriate to circumstance  Speech Logical/coherent  Interaction Assertive  Motor Activity Fidgety  Appearance/Hygiene In scrubs;Unremarkable  Behavior Characteristics Cooperative  Mood Pleasant  Thought Process  Coherency WDL  Content Delusions;Paranoia  Delusions Paranoid;Persecutory;Somatic  Perception Hallucinations  Hallucination Auditory;Tactile  Judgment Poor  Confusion None  Danger to Self  Current suicidal ideation? Denies  Danger to Others  Danger to Others None reported or observed  Dar Note: Patient presents with a calm affect and mood.  Patient visible in milieu for majority of this shift.  Safety checks maintained.  Denies suicidal thoughts, auditory and visual hallucinations.  No acute distress noted.  Patient is safe on the unit.

## 2020-10-10 NOTE — Progress Notes (Signed)
Pt has been visible in the dayroom watching TV. Pt has not been interactive as the night before. Denied SI/HI and reports good day. Respiration are easy and unlabored. Support and encouragement offered as needed, will continue to monitor.

## 2020-10-10 NOTE — Progress Notes (Signed)
[facility] MD Progress Note  10/10/2020 7:51 AM Amy Rowe  MRN:  [id number]   Chief Complaint: auditory hallucinations  Subjective: Amy Rowe is a 45 y.o. female with a history of MDD, who was initially admitted for inpatient psychiatric hospitalization on 10/05/2020 for management of command AH telling her not to eat and to kill herself. The patient is currently on Hospital Day 5.   Chart Review from last 24 hours:  The patient's chart was reviewed and nursing notes were reviewed. The patient's case was discussed in multidisciplinary team meeting. Per nursing notes, the patient has been visible in the dayroom watching TV and interacting with peers and staff. She has denied SI or HI and denied AVH on dayshift but has had ongoing somatic delusions. Per [facility] she ws compliant with scheduled medications and required Trazodone for sleep. She has remained afebrile with sats 97-100% RA.  Information Obtained Today During Patient Interview: The patient was seen and evaluated on the unit. She states the [facility] are less intense and less frequent but are still present. She describes them as overhearing a conversation in her head but denies command [place] today. She denies VH. She denies ideas of reference. She continues to believe the voices inside her head "are pulling on her thoughts and movements" but she states she feels more in control of her mind and body today. She denies SI or HI. She voices no physical complaints other than some scalp tension from her hair braids. She reports good sleep and appetite and denies medication side effects. She plans to talk with her aunt about whether or not she can return to her aunt's home after discharge.   Principal Problem: Schizophrenia spectrum disorder with psychotic disorder type not yet determined ([place]) Diagnosis: Principal Problem:   Schizophrenia spectrum disorder with psychotic disorder type not yet determined ([place])  Total Time Spent in Direct Patient Care:  I  personally spent 25 minutes on the unit in direct patient care. The direct patient care time included face-to-face time with the patient, reviewing the patient's chart, communicating with other professionals, and coordinating care. Greater than 50% of this time was spent in counseling or coordinating care with the patient regarding goals of hospitalization, psycho-education, and discharge planning needs.  Past Psychiatric History: see admission H&P  Past Medical History:  Past Medical History:  Diagnosis Date  . Anxiety   . Asthma   . Bronchitis   . Depression     Past Surgical History:  Procedure Laterality Date  . CESAREAN SECTION     Family History: History reviewed. No pertinent family history.   Family Psychiatric  History: see admission H&P  Social History:  Social History   Substance and Sexual Activity  Alcohol Use Yes   Comment: socially     Social History   Substance and Sexual Activity  Drug Use No    Social History   Socioeconomic History  . Marital status: Divorced    Spouse name: Not on file  . Number of children: Not on file  . Years of education: Not on file  . Highest education level: Not on file  Occupational History  . Not on file  Tobacco Use  . Smoking status: Current Every Day Smoker    Types: Cigarettes  . Smokeless tobacco: Never Used  Substance and Sexual Activity  . Alcohol use: Yes    Comment: socially  . Drug use: No  . Sexual activity: Not on file  Other Topics Concern  .  Not on file  Social History Narrative  . Not on file   Social Determinants of Health   Financial Resource Strain: Not on file  Food Insecurity: Not on file  Transportation Needs: Not on file  Physical Activity: Not on file  Stress: Not on file  Social Connections: Not on file   Sleep: Good per patient report  Appetite:  Good per patient report  Current Medications: Current Facility-Administered Medications  Medication Dose Route Frequency Provider  Last Rate Last Admin  . acetaminophen (TYLENOL) tablet 650 mg  650 mg Oral Q6H PRN Merlyn Lot E, NP   650 mg at 10/09/20 1700  . albuterol (VENTOLIN HFA) 108 (90 Base) MCG/ACT inhaler 2 puff  2 puff Inhalation Q6H PRN Harlow Asa, MD   2 puff at 10/08/20 2120  . alum & mag hydroxide-simeth (MAALOX/MYLANTA) 200-200-20 MG/5ML suspension 30 mL  30 mL Oral Q4H PRN Caroline Sauger, NP      . benztropine (COGENTIN) tablet 0.5 mg  0.5 mg Oral BID Nelda Marseille, Chelesa Weingartner E, MD   0.5 mg at 10/09/20 1700  . citalopram (CELEXA) tablet 20 mg  20 mg Oral Daily Nwoko, Agnes I, NP   20 mg at 10/09/20 0930  . fluticasone (FLONASE) 50 MCG/ACT nasal spray 1 spray  1 spray Each Nare Daily PRN Harlow Asa, MD   1 spray at 10/08/20 2118  . hydrOXYzine (ATARAX/VISTARIL) tablet 25 mg  25 mg Oral TID PRN Mallie Darting, NP   25 mg at 10/09/20 2105  . magnesium hydroxide (MILK OF MAGNESIA) suspension 30 mL  30 mL Oral Daily PRN Merlyn Lot E, NP      . OLANZapine (ZYPREXA) tablet 10 mg  10 mg Oral QHS Sharma Covert, MD   10 mg at 10/09/20 2105  . OLANZapine (ZYPREXA) tablet 5 mg  5 mg Oral Daily Viann Fish E, MD   5 mg at 10/09/20 0932  . traZODone (DESYREL) tablet 50 mg  50 mg Oral QHS PRN Lindon Romp A, NP   50 mg at 10/09/20 2105   Lab Results:  No results found for this or any previous visit (from the past 48 hour(s)).  Blood Alcohol level:  Lab Results  Component Value Date   ETH <10 10/03/2020   ETH 70 (H) 80/32/1224    Metabolic Disorder Labs: Lab Results  Component Value Date   HGBA1C 5.6 10/06/2020   MPG 114.02 10/06/2020   MPG 128 08/14/2015   No results found for: PROLACTIN Lab Results  Component Value Date   CHOL 259 (H) 10/06/2020   TRIG 145 10/06/2020   HDL 69 10/06/2020   CHOLHDL 3.8 10/06/2020   VLDL 29 10/06/2020   LDLCALC 161 (H) 10/06/2020   LDLCALC 138 (H) 08/14/2015   Physical Findings: AIMS: Facial and Oral Movements Muscles of Facial Expression: None,  normal Lips and Perioral Area: None, normal Jaw: None, normal Tongue: None, normal,Extremity Movements Upper (arms, wrists, hands, fingers): None, normal Lower (legs, knees, ankles, toes): None, normal, Trunk Movements Neck, shoulders, hips: None, normal, Overall Severity Severity of abnormal movements (highest score from questions above): None, normal Incapacitation due to abnormal movements: None, normal Patient's awareness of abnormal movements (rate only patient's report): No Awareness, Dental Status Current problems with teeth and/or dentures?: No Does patient usually wear dentures?: No   Musculoskeletal: Strength & Muscle Tone: within normal limits Gait & Station: normal , steady Patient leans: N/A  Psychiatric Specialty Exam: Physical Exam Vitals reviewed.  HENT:  Head: Normocephalic.  Pulmonary:     Effort: Pulmonary effort is normal.  Neurological:     Mental Status: She is alert.     Review of Systems  Constitutional: Negative for fever.  HENT: Positive for congestion.   Respiratory: Negative for shortness of breath.   Cardiovascular: Negative for chest pain.    Blood pressure 102/79, pulse 76, temperature 98 F (36.7 C), temperature source Oral, resp. rate 20, height 5' 3"  (1.6 m), weight 90.2 kg, SpO2 97 %.Body mass index is 35.23 kg/m.  General Appearance: good hygiene, appears stated age, well engaged with examiner  Eye Contact:  Good  Speech:  Clear and Coherent and Normal Rate  Volume:  Normal  Mood:  anxious  Affect: mildly anxious  Thought Process:  Goal Directed but ruminative   Orientation:  oriented grossly to person, place, and time  Thought Content:  Endorses residual AH of multiple internal voices and somatic delusions that she has a frequency or electrical current running through her body; has belief that [place] are attempting to manipulate her thoughts and actions; denies ideas of reference; is not grossly responding to internal/external stimuli  on exam  Suicidal Thoughts:  Denied  Homicidal Thoughts:  Denied  Memory:  Recent;   Fair  Judgement:  Fair  Insight:  Fair  Psychomotor Activity:  Normal  Concentration:  Concentration: Fair and Attention Span: Fair  Recall:  [employer] of Knowledge:  Fair  Language:  Good  Akathisia:  Negative  Assets:  Desire for Improvement Resilience Social Support  ADL's:  Intact  Cognition:  WNL  Sleep:  Number of Hours: 6   Treatment Plan Summary:  ASSESSMENT: Diagnoses / Active Problems: Unspecified schizophrenia spectrum and other psychotic d/o (r/o MDD recurrent severe with psychotic features, r/o schizoaffective d/o, r/o psychotic d/o secondary to general medical condition)  PLAN: 1. Safety and Monitoring:  -- Voluntary admission to inpatient psychiatric unit for safety, stabilization and treatment  -- Daily contact with patient to assess and evaluate symptoms and progress in treatment  -- Patient's case to be discussed in multi-disciplinary team meeting  -- Observation Level : q15 minute checks  -- Vital signs:  q12 hours  -- Precautions: suicide and COVID  2. Psychiatric Diagnoses and Treatment:   Unspecified schizophrenia spectrum and other psychotic d/o (r/o MDD recurrent severe with psychotic features, r/o schizoaffective d/o, r/o psychotic d/o secondary to general medical condition)  -- Continue Celexa 29m daily   -- Continue Zyprexa 568mqam and 1061mhs - will monitor for symptom improvement with recent dose change and titrate up as indicated  -- Continue Cogentin 0.58.4YKd   -- Metabolic profile and EKG monitoring obtained while on an atypical antipsychotic (BMI: 35.23; Lipid Panel:cholesterol 259, HDL 69, LDL 161, triglycerides 145; HbgA1c: 5.6 QTc:452)   -- Continue Vistaril 63m2md PRN anxiety  -- Increase Trazodone to 100mg32mqhs PRN insomnia  -- In context of new onset AH medical w/u done to r/o an organic etiology to her symptoms (Noncontrast Head CT on 10/03/20  was negative; ceruloplasmin 25; TSH 2.515, WBC 5.8, H/H 13.9/44.0, platelets 240, alcohol <10, UDS negative; CRP 0.7, B12 574, ESR 7, HIV nonreactive, RPR nonreactive, ANA negative, and heavy metal WNL)   -- Encouraged patient to participate in unit milieu and in scheduled group therapies   -- Short Term Goals: Ability to identify changes in lifestyle to reduce recurrence of condition will improve  -- Long Term Goals: Improvement in symptoms so  as ready for discharge    3. Medical Issues Being Addressed:   Hypokalemia (K+ 3.3) and Hyponatremia (Na+ 132)- resolved  -- Encouraging po intake and repeat BMP on 10/08/20 shows K+ 4.0 and Na+ 140   Hyperlipidemia (total cholesterol 259 with LDL 161)  -- encouraged low fat, high fiber diet and increased exercise with need to see PCP as an outpatient for management   COVID  -- Continue quarantine and symptomatic monitoring - currently afebrile with sats 97-100%  -- Flonase PRN for congestion and Albuterol MDI PRN for wheezing given reported h/o asthma  4. Discharge Planning:   -- Social work and case management to assist with discharge planning and identification of hospital follow-up needs prior to discharge; patient encouraged to contact family to confirm housing after discharge  -- Estimated LOS: 3-4 days  -- Discharge Concerns: Need to establish a safety plan; Medication compliance and effectiveness  -- Discharge Goals: Return home with outpatient referrals for mental health follow-up including medication management/psychotherapy  Harlow Asa, MD, FAPA 10/10/2020, 7:51 AM

## 2020-10-10 NOTE — [facility] Group Notes (Signed)
Type of Therapy: Group Therapy  Modes of Intervention: Controlling Negative Thoughts     Due to the COVID-19 pandemic, this group has been supplemented with worksheets.  Summary of Progress/Problems: CSW provided worksheet to patient due to COVID. CSW offered to meet individually with patient as needed  Melba Coon, [employer] Clinicial Social Worker [employer]

## 2020-10-11 DIAGNOSIS — F332 Major depressive disorder, recurrent severe without psychotic features: Secondary | ICD-10-CM

## 2020-10-11 MED ORDER — OLANZAPINE 5 MG PO TABS
5.0000 mg | ORAL_TABLET | Freq: Every day | ORAL | Status: DC | PRN
Start: 2020-10-11 — End: 2020-10-12

## 2020-10-11 MED ORDER — BENZTROPINE MESYLATE 0.5 MG PO TABS: 0.5000 mg | ORAL_TABLET | Freq: Two times a day (BID) | ORAL | Status: DC | PRN

## 2020-10-11 MED ORDER — OLANZAPINE 7.5 MG PO TABS
15.0000 mg | ORAL_TABLET | Freq: Every day | ORAL | Status: DC
  Administered 2020-10-11: 15 mg via ORAL
  Filled 2020-10-11 (×4): qty 2

## 2020-10-11 NOTE — Progress Notes (Signed)
DAR NOTE: Patient presents with a calm affect and mood.  Report decreased in voices with improved mood.  pain, auditory and visual hallucinations.  Rates depression at 1, hopelessness at 0, and anxiety at 1.  Maintained on routine safety checks.  Medications given as prescribed.  Support and encouragement offered as needed.  Attended group and participated.  States goal for today is "to stay motivated and positive about everything."  Patient observed socializing with staff in the dayroom.  No acute distress noted.  Offered no complaint.

## 2020-10-11 NOTE — [facility] Group Notes (Signed)
Type of Therapy: Group Therapy  Topic:  Anger  Modes of Intervention: Worksheets  Due to patient having COVID-19, worksheets were provided rather than in-person session.  Summary of Progress/Problems: CSW provided worksheets about anger triggers, understanding triggers, anger management techniques, expressing anger, vignettes, time-outs, and how anger affects your health.

## 2020-10-11 NOTE — Progress Notes (Signed)
[facility] MD Progress Note  10/11/2020 6:54 PM Amy Rowe  MRN:  [id number]   Chief Complaint: auditory hallucinations improving  Subjective: Amy Rowe is a 45 y.o. female with a history of MDD, who was initially admitted for inpatient psychiatric hospitalization on 10/05/2020 for management of command AH telling her not to eat and to kill herself. The patient is currently on Hospital Day 6.   Chart Review from last 24 hours:  The patient's chart was reviewed and nursing notes were reviewed. The patient's case was discussed in multidisciplinary team meeting. Per nursing notes, the patient has been visible in the dayroom watching TV and interacting with peers and staff. She has denied SI or HI and continue to endorse auditory hallucinations. Per [facility] she ws compliant with scheduled medications and required Trazodone for sleep. She has remained afebrile with sats 97-100% RA.  Information Obtained Today During Patient Interview: The patient was seen and evaluated on the unit. She states the [facility] are less intense and less frequent but are still present.  Occasionally, she can still hear them telling her to kill herself, but otherwise she states it is mostly a mumble and she cannot make out what they are saying.  She reports that sounds like she is "on the phone but the phone is in her head".  She is denying any visual hallucinations today.  She denies any suicidal or homicidal ideation.  She reports that she has had some difficulty getting along with a peer on the unit, but is doing her best to avoid her.  She states that the worksheets from social work have been helpful.  She is hoping for the voices to go away so that she can feel safe for discharge. She is hopeful to be able to return to her aunt's home after discharge.  Principal Problem: Schizophrenia spectrum disorder with psychotic disorder type not yet determined ([place]) Diagnosis: Principal Problem:   Schizophrenia spectrum disorder with  psychotic disorder type not yet determined ([place])  Total Time Spent in Direct Patient Care:  I personally spent 25 minutes on the unit in direct patient care. The direct patient care time included face-to-face time with the patient, reviewing the patient's chart, communicating with other professionals, and coordinating care. Greater than 50% of this time was spent in counseling or coordinating care with the patient regarding goals of hospitalization, psycho-education, and discharge planning needs.  Past Psychiatric History: see admission H&P  Past Medical History:  Past Medical History:  Diagnosis Date  . Anxiety   . Asthma   . Bronchitis   . Depression     Past Surgical History:  Procedure Laterality Date  . CESAREAN SECTION     Family History: History reviewed. No pertinent family history.   Family Psychiatric  History: see admission H&P  Social History:  Social History   Substance and Sexual Activity  Alcohol Use Yes   Comment: socially     Social History   Substance and Sexual Activity  Drug Use No    Social History   Socioeconomic History  . Marital status: Divorced    Spouse name: Not on file  . Number of children: Not on file  . Years of education: Not on file  . Highest education level: Not on file  Occupational History  . Not on file  Tobacco Use  . Smoking status: Current Every Day Smoker    Types: Cigarettes  . Smokeless tobacco: Never Used  Substance and Sexual Activity  . Alcohol  use: Yes    Comment: socially  . Drug use: No  . Sexual activity: Not on file  Other Topics Concern  . Not on file  Social History Narrative  . Not on file   Social Determinants of Health   Financial Resource Strain: Not on file  Food Insecurity: Not on file  Transportation Needs: Not on file  Physical Activity: Not on file  Stress: Not on file  Social Connections: Not on file   Sleep: Good per patient report  Appetite:  Good per patient report  Current  Medications: Current Facility-Administered Medications  Medication Dose Route Frequency Provider Last Rate Last Admin  . acetaminophen (TYLENOL) tablet 650 mg  650 mg Oral Q6H PRN Merlyn Lot E, NP   650 mg at 10/09/20 1700  . albuterol (VENTOLIN HFA) 108 (90 Base) MCG/ACT inhaler 2 puff  2 puff Inhalation Q6H PRN Harlow Asa, MD   2 puff at 10/08/20 2120  . alum & mag hydroxide-simeth (MAALOX/MYLANTA) 200-200-20 MG/5ML suspension 30 mL  30 mL Oral Q4H PRN Caroline Sauger, NP      . benztropine (COGENTIN) tablet 0.5 mg  0.5 mg Oral BID Viann Fish E, MD   0.5 mg at 10/11/20 1808  . citalopram (CELEXA) tablet 20 mg  20 mg Oral Daily Nwoko, Agnes I, NP   20 mg at 10/11/20 0830  . fluticasone (FLONASE) 50 MCG/ACT nasal spray 1 spray  1 spray Each Nare Daily PRN Harlow Asa, MD   1 spray at 10/08/20 2118  . hydrOXYzine (ATARAX/VISTARIL) tablet 25 mg  25 mg Oral TID PRN Mallie Darting, NP   25 mg at 10/10/20 2002  . magnesium hydroxide (MILK OF MAGNESIA) suspension 30 mL  30 mL Oral Daily PRN Merlyn Lot E, NP      . OLANZapine (ZYPREXA) tablet 10 mg  10 mg Oral QHS Sharma Covert, MD   10 mg at 10/10/20 2001  . OLANZapine (ZYPREXA) tablet 5 mg  5 mg Oral Daily Viann Fish E, MD   5 mg at 10/11/20 0829  . traZODone (DESYREL) tablet 100 mg  100 mg Oral QHS PRN Harlow Asa, MD   100 mg at 10/10/20 2001   Lab Results:  No results found for this or any previous visit (from the past 48 hour(s)).  Blood Alcohol level:  Lab Results  Component Value Date   ETH <10 10/03/2020   ETH 70 (H) 78/58/8502    Metabolic Disorder Labs: Lab Results  Component Value Date   HGBA1C 5.6 10/06/2020   MPG 114.02 10/06/2020   MPG 128 08/14/2015   No results found for: PROLACTIN Lab Results  Component Value Date   CHOL 259 (H) 10/06/2020   TRIG 145 10/06/2020   HDL 69 10/06/2020   CHOLHDL 3.8 10/06/2020   VLDL 29 10/06/2020   LDLCALC 161 (H) 10/06/2020   LDLCALC 138 (H)  08/14/2015   Physical Findings: AIMS: Facial and Oral Movements Muscles of Facial Expression: None, normal Lips and Perioral Area: None, normal Jaw: None, normal Tongue: None, normal,Extremity Movements Upper (arms, wrists, hands, fingers): None, normal Lower (legs, knees, ankles, toes): None, normal, Trunk Movements Neck, shoulders, hips: None, normal, Overall Severity Severity of abnormal movements (highest score from questions above): None, normal Incapacitation due to abnormal movements: None, normal Patient's awareness of abnormal movements (rate only patient's report): No Awareness, Dental Status Current problems with teeth and/or dentures?: No Does patient usually wear dentures?: No   Musculoskeletal: Strength &  Muscle Tone: within normal limits Gait & Station: normal , steady Patient leans: N/A  Psychiatric Specialty Exam: Physical Exam Vitals and nursing note reviewed. Exam conducted with a chaperone present.  Constitutional:      Appearance: Normal appearance.  HENT:     Head: Normocephalic.  Eyes:     Extraocular Movements: Extraocular movements intact.  Cardiovascular:     Rate and Rhythm: Normal rate.  Pulmonary:     Effort: Pulmonary effort is normal.  Musculoskeletal:        General: Normal range of motion.     Cervical back: Normal range of motion.  Neurological:     General: No focal deficit present.     Mental Status: She is alert and oriented to person, place, and time.     Review of Systems  Constitutional: Negative.  Negative for fever.  HENT: Negative.   Respiratory: Negative.  Negative for shortness of breath.   Cardiovascular: Negative.  Negative for chest pain.  Gastrointestinal: Negative.   Musculoskeletal: Negative.   Neurological: Negative.   Psychiatric/Behavioral: Negative for agitation, behavioral problems, confusion, decreased concentration, dysphoric mood, hallucinations, self-injury, sleep disturbance and suicidal ideas. The patient  is not nervous/anxious and is not hyperactive.     Blood pressure 102/79, pulse 76, temperature 98 F (36.7 C), temperature source Oral, resp. rate 20, height 5' 3"  (1.6 m), weight 90.2 kg, SpO2 97 %.Body mass index is 35.23 kg/m.  General Appearance: good hygiene, appears stated age, well engaged with examiner  Eye Contact:  Good  Speech:  Clear and Coherent and Normal Rate  Volume:  Normal  Mood:  anxious and euthymic  Affect: mildly anxious  Thought Process:  Goal Directed and Descriptions of Associations: Circumstantial  Orientation:  oriented grossly to person, place, and time  Thought Content:  Endorses residual AH of multiple internal voices.  Does not describe somatic delusions that she has a frequency or electrical current running through her body; has belief that [place] are attempting to manipulate her thoughts and actions; denies ideas of reference; is not grossly responding to internal/external stimuli on exam  Suicidal Thoughts:  Denied  Homicidal Thoughts:  Denied  Memory:  Recent;   Fair  Judgement:  Fair  Insight:  Fair  Psychomotor Activity:  Normal  Concentration:  Concentration: Fair and Attention Span: Fair  Recall:  [employer] of Knowledge:  Fair  Language:  Good  Akathisia:  Negative  Assets:  Desire for Improvement Resilience Social Support  ADL's:  Intact  Cognition:  WNL  Sleep:  Number of Hours: 6   Treatment Plan Summary:  ASSESSMENT: Diagnoses / Active Problems: Unspecified schizophrenia spectrum and other psychotic d/o (r/o MDD recurrent severe with psychotic features, r/o schizoaffective d/o, r/o psychotic d/o secondary to general medical condition)  PLAN: 1. Safety and Monitoring:  -- Voluntary admission to inpatient psychiatric unit for safety, stabilization and treatment  -- Daily contact with patient to assess and evaluate symptoms and progress in treatment  -- Patient's case to be discussed in multi-disciplinary team meeting  -- Observation  Level : q15 minute checks  -- Vital signs:  q12 hours  -- Precautions: suicide and COVID  2. Psychiatric Diagnoses and Treatment:   Unspecified schizophrenia spectrum and other psychotic d/o (r/o MDD recurrent severe with psychotic features, r/o schizoaffective d/o, r/o psychotic d/o secondary to general medical condition)  -- Continue Celexa 28m daily   --We will attempt to consolidate Zyprexa to a single dose and start 15 mg  at bedtime on 10/11/2020.  We will leave an additional Zyprexa 33m as needed daily for worsening auditory hallucinations.  If patient is requiring PRN dosing, will titrate bedtime dose to 20 mg.   - will monitor for symptom improvement with recent dose change and titrate up as indicated  --Change Cogentin to 0.510mbid PRN for dystonia  -- Metabolic profile and EKG monitoring obtained while on an atypical antipsychotic (BMI: 35.23; Lipid Panel:cholesterol 259, HDL 69, LDL 161, triglycerides 145; HbgA1c: 5.6 QTc:452)   -- Continue Vistaril 2559mid PRN anxiety  -- Increase Trazodone to 100m2m qhs PRN insomnia  -- In context of new onset AH medical w/u done to r/o an organic etiology to her symptoms (Noncontrast Head CT on 10/03/20 was negative; ceruloplasmin 25; TSH 2.515, WBC 5.8, H/H 13.9/44.0, platelets 240, alcohol <10, UDS negative; CRP 0.7, B12 574, ESR 7, HIV nonreactive, RPR nonreactive, ANA negative, and heavy metal WNL)   -- Encouraged patient to participate in unit milieu and in scheduled group therapies   -- Short Term Goals: Ability to identify changes in lifestyle to reduce recurrence of condition will improve  -- Long Term Goals: Improvement in symptoms so as ready for discharge    3. Medical Issues Being Addressed:   Hypokalemia (K+ 3.3) and Hyponatremia (Na+ 132)- resolved  -- Encouraging po intake and repeat BMP on 10/08/20 shows K+ 4.0 and Na+ 140   Hyperlipidemia (total cholesterol 259 with LDL 161)  -- encouraged low fat, high fiber diet and increased  exercise with need to see PCP as an outpatient for management   COVID  -- Continue quarantine and symptomatic monitoring - currently afebrile with sats 97-100%  -- Flonase PRN for congestion and Albuterol MDI PRN for wheezing given reported h/o asthma  4. Discharge Planning:   -- Social work and case management to assist with discharge planning and identification of hospital follow-up needs prior to discharge; patient encouraged to contact family to confirm housing after discharge  -- Estimated LOS: 7-10 days  -- Discharge Concerns: Need to establish a safety plan; Medication compliance and effectiveness  -- Discharge Goals: Return home with outpatient referrals for mental health follow-up including medication management/psychotherapy  SHEILavella Hammock 10/11/2020, 6:54 PM

## 2020-10-12 MED ORDER — OLANZAPINE 10 MG PO TABS
20.0000 mg | ORAL_TABLET | Freq: Every day | ORAL | Status: DC
  Administered 2020-10-12: 20 mg via ORAL
  Filled 2020-10-12 (×4): qty 2

## 2020-10-12 MED ORDER — OLANZAPINE 5 MG PO TABS
5.0000 mg | ORAL_TABLET | Freq: Every day | ORAL | Status: DC
  Administered 2020-10-12: 5 mg via ORAL
  Filled 2020-10-12 (×3): qty 1

## 2020-10-12 NOTE — Progress Notes (Signed)
Adult Psychoeducational Group Note  Date:  10/12/2020 Time:  8:25 PM  Group Topic/Focus:  Wrap-Up Group:   The focus of this group is to help patients review their daily goal of treatment and discuss progress on daily workbooks.  Participation Level:  Active  Participation Quality:  Appropriate  Affect:  Appropriate  Cognitive:  Appropriate  Insight: Appropriate  Engagement in Group:  Engaged  Modes of Intervention:  Discussion  Additional Comments:  Pt said her day was a 8. The one positive thing that happen to her today she learned coping skills when she start to stress out.  Charna Busman Long 10/12/2020, 8:25 PM

## 2020-10-12 NOTE — [facility] Group Notes (Signed)
Adult Psychoeducational Group Note  Date:  10/12/2020 Time:  12:50 PM  Group Topic/Focus:  Wrap-Up Group:   The focus of this group is to help patients review their daily goal of treatment and discuss progress on daily workbooks.  Participation Level:  Active  Participation Quality:  Appropriate  Affect:  Appropriate  Cognitive:  Appropriate  Insight: Appropriate  Engagement in Group:  Engaged  Modes of Intervention:  Discussion  Additional Comments:  Patient attended morning group and  actively participated.   Breonia Kirstein W Neshawn Aird 10/12/2020, 12:50 PM

## 2020-10-12 NOTE — Progress Notes (Signed)
   10/12/20 0500  Psych Admission Type (Psych Patients Only)  Admission Status Voluntary  Psychosocial Assessment  Eye Contact Fair  Facial Expression Anxious;Pensive;Worried;Animated  Affect Anxious;Preoccupied;Appropriate to circumstance  Speech Logical/coherent  Interaction Assertive  Motor Activity Fidgety  Appearance/Hygiene In scrubs;Unremarkable  Thought Process  Coherency WDL  Content Delusions;Paranoia  Delusions Paranoid;Persecutory;Somatic  Perception Hallucinations  Hallucination Auditory;Tactile  Judgment Poor  Confusion None  Danger to Self  Current suicidal ideation? Denies  Danger to Others  Danger to Others None reported or observed

## 2020-10-12 NOTE — [facility] Group Notes (Signed)
Type of Therapy: Group Therapy  Topic: Supports  Modes of Intervention: Worksheets  Due topatient havingCOVID-19, worksheets were provided rather than in-person session.  Summary of Progress/Problems: CSW provided worksheets including the following:  Crisis Plan for your Support System  Think Ahead  Tips for Talking with your Health Care Provider  My Mental Health:  Do I Need Help?  Depression in Women:  5 Things You Should Know  Amy Mantle, LCSW 10/12/2020, 2:10 PM

## 2020-10-12 NOTE — Progress Notes (Signed)
Patient presents with an anxious mood. She denies SI/HI. She reports decreased depression and anxiety. She reports decreased AVH. She reports feeling the presence of a body standing next to her and walking along side her.  Orders reviewed with pt. Vital signs reviewed. Verbal support provided. 15 minute checks performed for safety.   Pt compliant with treatment plan. Patient denies any medication side effects.

## 2020-10-12 NOTE — Progress Notes (Signed)
[facility] MD Progress Note  10/12/2020 6:46 PM Amy Rowe  MRN:  [id number]   Chief Complaint: auditory hallucinations improving  Subjective: Amy Rowe is a 45 y.o. female with a history of MDD, who was initially admitted for inpatient psychiatric hospitalization on 10/05/2020 for management of command AH telling her not to eat and to kill herself. The patient is currently on Hospital Day 7.   Chart Review from last 24 hours:  The patient's chart was reviewed and nursing notes were reviewed. The patient's case was discussed in multidisciplinary team meeting.  She was able to attend a group therapy session today.  Per nursing notes, the patient has been visible in the dayroom watching TV and interacting with peers and staff. She has denied SI or HI, and continues to endorse auditory hallucinations which have significantly decreased as well as a presence of someone walking alongside her and standing next to her.  Per [facility] she ws compliant with scheduled medications and required Trazodone for sleep. She has remained afebrile with sats 97-100% RA.  Denies COVID symptoms.  Information Obtained Today During Patient Interview: The patient was seen and evaluated on the unit. She states the [facility] are almost completely gone.  She does describe a sense of someone alongside her, but denies a true visual hallucination.  Of note, the symptoms are hypnagogic and hypnopompic in nature.  Patient does recall that the symptoms are worse when she has insufficient sleep.  She states that she has been sleeping relatively well while here in the hospital but has difficulty falling asleep and sometimes staying asleep.  She states that she has ruminating thoughts that keep her from falling asleep at sleep onset and after awakenings.  She describes significant stress related to her aunt with whom she lives.  She notes that she was in the home first, but now her aunt is telling her she can come there after discharge but will need  to get a job and find her own place to live.  Patient reports that she has had issues with her aunt for some time.  She describes her symptoms worsened when she changed jobs in June 2021 from working at [employer] to [employer].  She states that the job is less stressful, and cannot think of any other triggers for the onset of her symptoms.  She states her auditory hallucinations started shortly after the change to her new job, and the sense of the presence alongside her started in August or September 2021.  She describes the voices will tell her to throw away her stuffed animals, and this causes her stress as they are therapeutic to her, like having a pet that you do not have to take care of.  She endorses anxiety when she has to think about getting rid of these things.  She does note that she has been able to get rid of other things without stress, but wants to keep things that are important to her.  She notes that she has had many of the stuffed animals for a long time and they have a lot of sentimental value as well.  She also notes that she is not the type of person who throws things away that she may need later as she does not have much disposable income.  Patient is looking forward to having therapy after discharge.  She reports that she is tolerating her medication well.  She is appreciative of having groups today.  Patient does complain of right  shoulder stiffness and decreased range of motion.  She also complains of right toe pain.  Reassured that these are on likely due to medication.  She has no other cogwheeling or rigidity, and describes no akathisia. She denies any suicidal or homicidal ideation.   Principal Problem: Schizophrenia spectrum disorder with psychotic disorder type not yet determined ([place]) Diagnosis: Principal Problem:   Schizophrenia spectrum disorder with psychotic disorder type not yet determined ([place])  Total Time Spent in Direct Patient Care:  I personally spent 35 minutes on the  unit in direct patient care. The direct patient care time included face-to-face time with the patient, reviewing the patient's chart, communicating with other professionals, and coordinating care. Greater than 50% of this time was spent in counseling or coordinating care with the patient regarding goals of hospitalization, psycho-education, and discharge planning needs.  Past Psychiatric History: see admission H&P  Past Medical History:  Past Medical History:  Diagnosis Date  . Anxiety   . Asthma   . Bronchitis   . Depression     Past Surgical History:  Procedure Laterality Date  . CESAREAN SECTION     Family History: History reviewed. No pertinent family history.   Family Psychiatric  History: see admission H&P  Social History:  Social History   Substance and Sexual Activity  Alcohol Use Yes   Comment: socially     Social History   Substance and Sexual Activity  Drug Use No    Social History   Socioeconomic History  . Marital status: Divorced    Spouse name: Not on file  . Number of children: Not on file  . Years of education: Not on file  . Highest education level: Not on file  Occupational History  . Not on file  Tobacco Use  . Smoking status: Current Every Day Smoker    Types: Cigarettes  . Smokeless tobacco: Never Used  Substance and Sexual Activity  . Alcohol use: Yes    Comment: socially  . Drug use: No  . Sexual activity: Not on file  Other Topics Concern  . Not on file  Social History Narrative  . Not on file   Social Determinants of Health   Financial Resource Strain: Not on file  Food Insecurity: Not on file  Transportation Needs: Not on file  Physical Activity: Not on file  Stress: Not on file  Social Connections: Not on file   Sleep: Good per patient report  Appetite:  Good per patient report  Current Medications: Current Facility-Administered Medications  Medication Dose Route Frequency Provider Last Rate Last Admin  .  acetaminophen (TYLENOL) tablet 650 mg  650 mg Oral Q6H PRN Mallie Darting, NP   650 mg at 10/11/20 1952  . albuterol (VENTOLIN HFA) 108 (90 Base) MCG/ACT inhaler 2 puff  2 puff Inhalation Q6H PRN Harlow Asa, MD   2 puff at 10/08/20 2120  . alum & mag hydroxide-simeth (MAALOX/MYLANTA) 200-200-20 MG/5ML suspension 30 mL  30 mL Oral Q4H PRN Caroline Sauger, NP      . benztropine (COGENTIN) tablet 0.5 mg  0.5 mg Oral BID PRN Lavella Hammock, MD      . citalopram (CELEXA) tablet 20 mg  20 mg Oral Daily Lindell Spar I, NP   20 mg at 10/12/20 0750  . fluticasone (FLONASE) 50 MCG/ACT nasal spray 1 spray  1 spray Each Nare Daily PRN Harlow Asa, MD   1 spray at 10/11/20 2210  . hydrOXYzine (ATARAX/VISTARIL) tablet 25  mg  25 mg Oral TID PRN Mallie Darting, NP   25 mg at 10/10/20 2002  . magnesium hydroxide (MILK OF MAGNESIA) suspension 30 mL  30 mL Oral Daily PRN Merlyn Lot E, NP      . OLANZapine (ZYPREXA) tablet 15 mg  15 mg Oral QHS Lavella Hammock, MD   15 mg at 10/11/20 2040  . OLANZapine (ZYPREXA) tablet 5 mg  5 mg Oral Daily Lavella Hammock, MD   5 mg at 10/12/20 0959  . traZODone (DESYREL) tablet 100 mg  100 mg Oral QHS PRN Harlow Asa, MD   100 mg at 10/11/20 1950   Lab Results:  No results found for this or any previous visit (from the past 30 hour(s)).  Blood Alcohol level:  Lab Results  Component Value Date   ETH <10 10/03/2020   ETH 70 (H) 81/77/1165    Metabolic Disorder Labs: Lab Results  Component Value Date   HGBA1C 5.6 10/06/2020   MPG 114.02 10/06/2020   MPG 128 08/14/2015   No results found for: PROLACTIN Lab Results  Component Value Date   CHOL 259 (H) 10/06/2020   TRIG 145 10/06/2020   HDL 69 10/06/2020   CHOLHDL 3.8 10/06/2020   VLDL 29 10/06/2020   LDLCALC 161 (H) 10/06/2020   LDLCALC 138 (H) 08/14/2015   Physical Findings: AIMS: Facial and Oral Movements Muscles of Facial Expression: None, normal Lips and Perioral Area: None,  normal Jaw: None, normal Tongue: None, normal,Extremity Movements Upper (arms, wrists, hands, fingers): None, normal Lower (legs, knees, ankles, toes): None, normal, Trunk Movements Neck, shoulders, hips: None, normal, Overall Severity Severity of abnormal movements (highest score from questions above): None, normal Incapacitation due to abnormal movements: None, normal Patient's awareness of abnormal movements (rate only patient's report): No Awareness, Dental Status Current problems with teeth and/or dentures?: No Does patient usually wear dentures?: No   Musculoskeletal: Strength & Muscle Tone: within normal limits Gait & Station: normal , steady Patient leans: N/A  Psychiatric Specialty Exam: Physical Exam Vitals and nursing note reviewed. Exam conducted with a chaperone present.  Constitutional:      Appearance: Normal appearance.  HENT:     Head: Normocephalic.  Eyes:     Extraocular Movements: Extraocular movements intact.  Cardiovascular:     Rate and Rhythm: Normal rate.  Pulmonary:     Effort: Pulmonary effort is normal. No respiratory distress.     Breath sounds: No wheezing.  Musculoskeletal:        General: Normal range of motion.     Cervical back: Normal range of motion.  Neurological:     General: No focal deficit present.     Mental Status: She is alert and oriented to person, place, and time.     Review of Systems  Constitutional: Negative.  Negative for fever.  HENT: Negative.   Respiratory: Negative.  Negative for shortness of breath.   Cardiovascular: Negative.  Negative for chest pain.  Gastrointestinal: Negative.   Musculoskeletal: Negative.   Neurological: Negative.   Psychiatric/Behavioral: Positive for hallucinations (vague AH and "presence"). Negative for agitation, behavioral problems, confusion, decreased concentration, dysphoric mood, self-injury, sleep disturbance and suicidal ideas. The patient is not nervous/anxious and is not  hyperactive.     Blood pressure 105/80, pulse 75, temperature 98.5 F (36.9 C), temperature source Oral, resp. rate 16, height 5' 3"  (1.6 m), weight 90.2 kg, SpO2 100 %.Body mass index is 35.23 kg/m.  General Appearance: good hygiene,  appears stated age, well engaged with examiner  Eye Contact:  Good  Speech:  Clear and Coherent and Normal Rate  Volume:  Normal  Mood: Mildly anxious and euthymic  Affect: mildly anxious  Thought Process:  Goal Directed and Descriptions of Associations: Circumstantial  Orientation:  oriented grossly to person, place, and time  Thought Content: Hypnagogic and hypnopompic hallucinations.  Does not describe somatic delusions that she has a frequency or electrical current running through her body; has belief that [place] are attempting to manipulate her thoughts and actions; denies ideas of reference; is not grossly responding to internal/external stimuli on exam  Suicidal Thoughts:  Denied  Homicidal Thoughts:  Denied  Memory:  Recent;   Fair  Judgement:  Fair  Insight:  Fair  Psychomotor Activity:  Normal  Concentration:  Concentration: Fair and Attention Span: Fair  Recall:  [employer] of Knowledge:  Fair  Language:  Good  Akathisia:  Negative  Assets:  Desire for Improvement Resilience Social Support  ADL's:  Intact  Cognition:  WNL  Sleep:  Number of Hours: 6   Treatment Plan Summary:  ASSESSMENT: Diagnoses / Active Problems: Unspecified schizophrenia spectrum and other psychotic d/o (r/o MDD recurrent severe with psychotic features, r/o schizoaffective d/o, r/o psychotic d/o secondary to general medical condition)  PLAN: 1. Safety and Monitoring:  -- Voluntary admission to inpatient psychiatric unit for safety, stabilization and treatment  -- Daily contact with patient to assess and evaluate symptoms and progress in treatment  -- Patient's case to be discussed in multi-disciplinary team meeting  -- Observation Level : q15 minute checks  --  Vital signs:  q12 hours  -- Precautions: suicide and COVID  2. Psychiatric Diagnoses and Treatment:   Unspecified schizophrenia spectrum and other psychotic d/o (r/o MDD recurrent severe with psychotic features, r/o schizoaffective d/o, r/o psychotic d/o secondary to general medical condition)  -- Continue Celexa 56m daily   --We will attempt to consolidate Zyprexa to a single dose and start 15 mg at bedtime on 10/11/2020.  We will leave an additional Zyprexa 571mas needed daily for worsening auditory hallucinations. Patient did require additional 5 mg PRN dosing.  --Increase Zyprexa to 20 mg at bedtime on 10/12/2018 continue to monitor.  - will monitor for symptom improvement with recent dose change and titrate up as indicated  --Change Cogentin to 0.1m61mid PRN for dystonia  -- Metabolic profile and EKG monitoring obtained while on an atypical antipsychotic (BMI: 35.23; Lipid Panel:cholesterol 259, HDL 69, LDL 161, triglycerides 145; HbgA1c: 5.6 QTc:452)   -- Continue Vistaril 21m31md PRN anxiety  --Continue trazodone to 100mg57mqhs PRN insomnia  -- In context of new onset AH medical w/u done to r/o an organic etiology to her symptoms (Noncontrast Head CT on 10/03/20 was negative; ceruloplasmin 25; TSH 2.515, WBC 5.8, H/H 13.9/44.0, platelets 240, alcohol <10, UDS negative; CRP 0.7, B12 574, ESR 7, HIV nonreactive, RPR nonreactive, ANA negative, and heavy metal WNL)   -- Encouraged patient to participate in unit milieu and in scheduled group therapies   -- Short Term Goals: Ability to identify changes in lifestyle to reduce recurrence of condition will improve  -- Long Term Goals: Improvement in symptoms so as ready for discharge    3. Medical Issues Being Addressed:   Hypokalemia (K+ 3.3) and Hyponatremia (Na+ 132)- resolved  -- Encouraging po intake and repeat BMP on 10/08/20 shows K+ 4.0 and Na+ 140   Hyperlipidemia (total cholesterol 259 with LDL  161)  -- encouraged low fat, high fiber diet  and increased exercise with need to see PCP as an outpatient for management   COVID  -- Continue quarantine and symptomatic monitoring - currently afebrile with sats 97-100%  -- Flonase PRN for congestion and Albuterol MDI PRN for wheezing given reported h/o asthma  4. Discharge Planning:   -- Social work and case management to assist with discharge planning and identification of hospital follow-up needs prior to discharge; patient encouraged to contact family to confirm housing after discharge  -- Estimated LOS: 7-14 days  -- Discharge Concerns: Need to establish a safety plan; Medication compliance and effectiveness  -- Discharge Goals: Return home with outpatient referrals for mental health follow-up including medication management/psychotherapy  Lavella Hammock, MD 10/12/2020, 6:46 PM

## 2020-10-13 MED ORDER — BENZTROPINE MESYLATE 0.5 MG PO TABS: 0.5000 mg | ORAL_TABLET | Freq: Two times a day (BID) | ORAL | 0 refills | Status: DC | PRN

## 2020-10-13 MED ORDER — CITALOPRAM HYDROBROMIDE 20 MG PO TABS
20.0000 mg | ORAL_TABLET | Freq: Every day | ORAL | 0 refills | Status: DC
Start: 2020-10-13 — End: 2023-08-22

## 2020-10-13 MED ORDER — ALBUTEROL SULFATE HFA 108 (90 BASE) MCG/ACT IN AERS: 2.0000 | INHALATION_SPRAY | Freq: Four times a day (QID) | RESPIRATORY_TRACT | 0 refills | Status: DC | PRN

## 2020-10-13 MED ORDER — FLUTICASONE PROPIONATE 50 MCG/ACT NA SUSP: 1.0000 | Freq: Every day | NASAL | 0 refills | Status: DC | PRN

## 2020-10-13 MED ORDER — TRAZODONE HCL 100 MG PO TABS: 100.0000 mg | ORAL_TABLET | Freq: Every evening | ORAL | 0 refills | Status: DC | PRN

## 2020-10-13 MED ORDER — OLANZAPINE 20 MG PO TABS
20.0000 mg | ORAL_TABLET | Freq: Every day | ORAL | 0 refills | Status: DC
Start: 2020-10-13 — End: 2021-08-05

## 2020-10-13 NOTE — Progress Notes (Signed)
  [facility] Adult Case Management Discharge Plan :  Will you be returning to the same living situation after discharge:  Yes,  Home At discharge, do you have transportation home?: Yes,  Safe Transport  Do you have the ability to pay for your medications: Yes,  Family  Release of information consent forms completed and in the chart;  Patient's signature needed at discharge.  Patient to Follow up at:  Follow-up Information    [facility]. Go on 10/16/2020.   Why: You have a hospital follow up appointment on 10/16/20 at 9:00 am for therapy and medication management services.  This appointment will be held in person. Please bring your photo ID and any insurance or proof of income you may have.  Contact information: [address] [place] [address] [phone]               Next level of care provider has access to [facility] Link:no  Safety Planning and Suicide Prevention discussed: Yes,  with daughter and patient  Have you used any form of tobacco in the last 30 days? (Cigarettes, Smokeless Tobacco, Cigars, and/or Pipes): Yes  Has patient been referred to the Quitline?: Patient refused referral  Patient has been referred for addiction treatment: N/A  Aram Beecham, LCSWA 10/13/2020, 11:46 AM

## 2020-10-13 NOTE — Progress Notes (Signed)
Discharge Note:  Patient denies SI/HI AVH at this time. Discharge instructions, AVS, prescriptions and transition record gone over with patient. Patient agrees to comply with medication management, follow-up visit, and outpatient therapy. Patient belongings returned to patient. Patient questions and concerns addressed and answered.  Patient ambulatory off unit.  Patient discharged to home.   
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
## 2020-10-13 NOTE — Discharge Summary (Signed)
Physician Discharge Summary Note  Patient:  Amy Rowe is an 45 y.o., female MRN:  [id number] DOB:  10/03/1975 Patient phone:  [phone] (home)  Patient address:   [address] [place] [id number],  Total Time spent with patient: 30 minutes  Date of Admission:  10/05/2020 Date of Discharge: 10/13/2020  Reason for Admission:  (Per Md's admission evaluation notes): Patient is seen and examined. Patient is a 45 year old female with a reported past psychiatric history significant for major depression with psychotic features who presented to the [facility] emergency department on 10/03/2020 with complaint of auditory hallucinations since June 2021. She stated that the voices were telling her to kill her self and not to eat. She also reported an odd sensation in her female private area that had also been present since June of last year. She stated to the comprehensive clinical assessment team that she had voices in her head that were carrying on conversations. The voices were telling her to kill her self, and to stay in bed, not to eat, and to take a bunch of medications and walk in front of a train. She also stated that she felt as though there was an electrical pole on her body. She felt as though she was being molested and raped. She admitted to worsening depressive symptoms. She admitted to occasional alcohol. Her last psychiatric admission to our facility was in 2016. She was admitted at that time because of a break-up of a relationship. She also stated that her daughter had been previously diagnosed with schizophrenia. Her blood alcohol on admission at that time was 70. She was hospitalized for 6 days, and discharged on citalopram, hydroxyzine as well as trazodone. Her blood alcohol in the [facility] emergency room on this admission was less than 10, drug screen was negative. The decision was made to admit her to the hospital for evaluation and stabilization. It should also be  noted that when screened with the respiratory panel for influenza A, influenza B that she ended up positive for coronavirus. She was admitted to the [address] hall, currently being used for COVID patients.   Principal Problem: Schizophrenia spectrum disorder with psychotic disorder type not yet determined [facility](HCC) Discharge Diagnoses: Principal Problem:   Schizophrenia spectrum disorder with psychotic disorder type not yet determined [facility](HCC)   Past Psychiatric History: Major depression, Anxiety disorder.  Past Medical History:  Past Medical History:  Diagnosis Date  . Anxiety   . Asthma   . Bronchitis   . Depression     Past Surgical History:  Procedure Laterality Date  . CESAREAN SECTION     Family History: History reviewed. No pertinent family history. Family Psychiatric  History: "I think my grandmama had something". Social History:  Social History   Substance and Sexual Activity  Alcohol Use Yes   Comment: socially     Social History   Substance and Sexual Activity  Drug Use No    Social History   Socioeconomic History  . Marital status: Divorced    Spouse name: Not on file  . Number of children: Not on file  . Years of education: Not on file  . Highest education level: Not on file  Occupational History  . Not on file  Tobacco Use  . Smoking status: Current Every Day Smoker    Types: Cigarettes  . Smokeless tobacco: Never Used  Substance and Sexual Activity  . Alcohol use: Yes    Comment: socially  . Drug use: No  .  Sexual activity: Not on file  Other Topics Concern  . Not on file  Social History Narrative  . Not on file   Social Determinants of Health   Financial Resource Strain: Not on file  Food Insecurity: Not on file  Transportation Needs: Not on file  Physical Activity: Not on file  Stress: Not on file  Social Connections: Not on file    Hospital Course:  After the above admission evaluation, Kariana's presenting symptoms were noted. She was  recommended for mood stabilization treatments. The medication regimen targeting those presenting symptoms were discussed with her & initiated with her consent. Her UDS and Ethanol were negative on arrival to the ED. She underwent testing to rule out potential organic causes for new onset psychosis and the following levels were all within normal range; salicylate, acetaminophen, mercury, lead, ceruloplasmin, CRP, ANA, sed rate, vitamin B12, and arsenic. Her TSH was 2.515 normal. Her cholesterol was 259 and LDL 161 and she is recommended to follow up with her PCP for future monitoring. She was also reminded she will need ongoing metabolic lab, weight, and EKG monitoring while on Zyprexa and agrees to keep scheduled f/u appointment.   She has been advised to follow up with her PCP for any needed referrals to a cardiologist due to her complaint of "pulsing" feeling in her chest occasionally, as reported by her when she was in the emergency room. She has been vague when asked about these symptoms during her hospitalization and has repeatedly denied to consent for a repeat EKG. Her initial EKG showed NSR withsinus arrhythmia and possible Left atrial enlargement, QTc 452. The risks for an undiagnosed cardiac issue have been discussed with her and she has verbalized understanding of the importance of follow up should these symptoms return or worsen. Today, she denies chest pain, shortness of breath with exertion, palpitations, or syncope.    Her presenting depressive symptoms were medicated, stabilized & she is being discharged on the medications as listed below on her discharge medication list below. Besides the mood stabilization treatments, Armentha was also enrolled & participated in the group counseling sessions being offered & held on this unit. She learned coping skills. She presented no other significant pre-existing medical issues that required treatment. She tolerated his treatment regimen without any adverse  effects or reactions reported.   During the course of her hospitalization, the 15-minute checks were adequate to ensure patient's safety. Jacayla did not display any dangerous, violent or suicidal behavior on the unit. She interacted with patients & staff appropriately, participated appropriately in the group sessions/therapies. Her medications were addressed & adjusted to meet her needs. She was recommended for outpatient follow-up care & medication management upon discharge to assure continuity of care & mood stability.  At the time of discharge patient is not reporting any acute suicidal/homicidal ideations. She feels more confident about her self-care & in managing his mental health. She currently denies any new issues or concerns. Education and supportive counseling provided throughout her hospital stay & upon discharge.   Today upon her discharge evaluation with the attending psychiatrist, Cristella shares she is doing well. She denies any other specific concerns. Her sleep has improved. Her appetite is good. She denies other physical complaints. She denies AH/VH, delusional thoughts or paranoia. She does not appear to be responding to any internal stimuli. She feels that her medications have been helpful & is in agreement to continue her current treatment regimen as recommended. She was able to engage in safety planning  including plan to return to [facility] or contact emergency services if she feels unable to maintain her own safety or the safety of others. Pt had no further questions, comments, or concerns. She left [facility] with all personal belongings in no apparent distress. She was COVID positive on 1/28 and is on day 10 of quarantine. She was offered the option of remaining in the hospital one more day or to return home wearing an N95 mask, she opted for the later. Transportation per [employer] to her home.   Physical Findings: AIMS: Facial and Oral Movements Muscles of Facial Expression: None,  normal Lips and Perioral Area: None, normal Jaw: None, normal Tongue: None, normal,Extremity Movements Upper (arms, wrists, hands, fingers): None, normal Lower (legs, knees, ankles, toes): None, normal, Trunk Movements Neck, shoulders, hips: None, normal, Overall Severity Severity of abnormal movements (highest score from questions above): None, normal Incapacitation due to abnormal movements: None, normal Patient's awareness of abnormal movements (rate only patient's report): No Awareness, Dental Status Current problems with teeth and/or dentures?: No Does patient usually wear dentures?: No  CIWA:    COWS:     Musculoskeletal: Strength & Muscle Tone: within normal limits Gait & Station: normal Patient leans: N/A  Psychiatric Specialty Exam: See Psychiatric Specialty Exam and Suicide Risk Assessment completed by Attending Physician prior to discharge. Physical Exam Constitutional:      Appearance: Normal appearance.  HENT:     Head: Normocephalic and atraumatic.  Musculoskeletal:        General: Normal range of motion.     Cervical back: Normal range of motion.  Neurological:     General: No focal deficit present.     Mental Status: She is alert and oriented to person, place, and time.  Psychiatric:        Attention and Perception: Attention normal.        Mood and Affect: Mood is anxious.        Speech: Speech normal.        Behavior: Behavior normal. Behavior is cooperative.        Thought Content: Thought content normal.        Cognition and Memory: Cognition normal.     Review of Systems  Constitutional: Negative for activity change and appetite change.  Respiratory: Negative for chest tightness and shortness of breath.   Cardiovascular: Negative for chest pain.  Gastrointestinal: Negative for abdominal pain.  Neurological: Negative for facial asymmetry and headaches.    Blood pressure 126/75, pulse 86, temperature 98.3 F (36.8 C), temperature source Oral, resp.  rate 16, height 5\' 3"  (1.6 m), weight 90.2 kg, SpO2 100 %.Body mass index is 35.23 kg/m.  Sleep:  Number of Hours: 6     Have you used any form of tobacco in the last 30 days? (Cigarettes, Smokeless Tobacco, Cigars, and/or Pipes): Yes  Has this patient used any form of tobacco in the last 30 days? (Cigarettes, Smokeless Tobacco, Cigars, and/or Pipes) Yes, Yes, A prescription for an FDA-approved tobacco cessation medication was offered at discharge and the patient refused, patient refused smoking cessation education.   Blood Alcohol level:  Lab Results  Component Value Date   ETH <10 10/03/2020   ETH 70 (H) 08/13/2015    Metabolic Disorder Labs:  Lab Results  Component Value Date   HGBA1C 5.6 10/06/2020   MPG 114.02 10/06/2020   MPG 128 08/14/2015   No results found for: PROLACTIN Lab Results  Component Value Date   CHOL 259 (H)  10/06/2020   TRIG 145 10/06/2020   HDL 69 10/06/2020   CHOLHDL 3.8 10/06/2020   VLDL 29 10/06/2020   LDLCALC 161 (H) 10/06/2020   LDLCALC 138 (H) 08/14/2015    See Psychiatric Specialty Exam and Suicide Risk Assessment completed by Attending Physician prior to discharge.  Discharge destination:  Home  Is patient on multiple antipsychotic therapies at discharge:  No   Has Patient had three or more failed trials of antipsychotic monotherapy by history:  Yes,   Antipsychotic medications that previously failed include:   1.  Citalopram., 2.  Trazodone . and 3.  Xanax.  Recommended Plan for Multiple Antipsychotic Therapies: NA  Discharge Instructions    Diet - low sodium heart healthy   Complete by: As directed    Increase activity slowly   Complete by: As directed      Allergies as of 10/13/2020      Reactions   Other    ANTIBIOTIC-Name is unknown. States that reaction occurred along time ago that included having an infection after   Latex Swelling, Rash      Medication List    STOP taking these medications   acetaminophen 325 MG  tablet Commonly known as: TYLENOL   hydrOXYzine 25 MG tablet Commonly known as: ATARAX/VISTARIL     TAKE these medications     Indication  albuterol 108 (90 Base) MCG/ACT inhaler Commonly known as: VENTOLIN HFA Inhale 2 puffs into the lungs every 6 (six) hours as needed for wheezing or shortness of breath.  Indication: Spasm of Lung Air Passages, Exercise-Induced Bronchospastic Disease   benztropine 0.5 MG tablet Commonly known as: COGENTIN Take 1 tablet (0.5 mg total) by mouth 2 (two) times daily as needed for tremors (dystonia).  Indication: Extrapyramidal Reaction caused by Medications   citalopram 20 MG tablet Commonly known as: CELEXA Take 1 tablet (20 mg total) by mouth daily.  Indication: Depression   fluticasone 50 MCG/ACT nasal spray Commonly known as: FLONASE Place 1 spray into both nostrils daily as needed for allergies or rhinitis.  Indication: Stuffy Nose, Nonallergic Rhinitis   OLANZapine 20 MG tablet Commonly known as: ZYPREXA Take 1 tablet (20 mg total) by mouth at bedtime.  Indication: Major Depressive Disorder   traZODone 100 MG tablet Commonly known as: DESYREL Take 1 tablet (100 mg total) by mouth at bedtime as needed for sleep (x1 repeat PRN). What changed:   medication strength  how much to take  reasons to take this  Indication: Trouble Sleeping       Follow-up Information    Services, [facility]. Go on 10/16/2020.   Why: You have a hospital follow up appointment on 10/16/20 at 9:00 am for therapy and medication management services.  This appointment will be held in person. Please bring your photo ID and any insurance or proof of income you may have.  Contact information: [address] [place] [address] [phone]               Follow-up recommendations:  Activity:  as tolerated Diet:  Heart Healthy  Comments:  Prescriptions e-prescribed to patient's pharmacy at discharge.   Patient agreeable to plan.  Given  opportunity to ask questions.  Appears to feel comfortable with discharge denies any current suicidal or homicidal thoughts. Patient is instructed prior to discharge to: Take all medications as prescribed by his/her mental healthcare provider. Report any adverse effects and or reactions from the medicines to his/her outpatient provider promptly. Patient has been instructed &  cautioned: To not engage in alcohol and or illegal drug use while on prescription medicines. In the event of worsening symptoms, patient is instructed to call the mobile crisis hotline [phone], 911, National Suicide Prevention Lifeline [phone] and/or go to the nearest ED for appropriate evaluation and treatment of symptoms. To follow-up with her primary care provider for your other medical issues, concerns and or health care needs.  Signed: Laveda Abbe, NP 10/13/2020, 11:57 AM

## 2020-12-08 ENCOUNTER — Encounter (HOSPITAL_COMMUNITY): Payer: Self-pay

## 2020-12-08 ENCOUNTER — Other Ambulatory Visit: Payer: Self-pay

## 2020-12-08 ENCOUNTER — Emergency Department (HOSPITAL_COMMUNITY): Payer: Self-pay

## 2020-12-08 ENCOUNTER — Emergency Department (HOSPITAL_COMMUNITY)
Admission: EM | Admit: 2020-12-08 | Discharge: 2020-12-08 | Disposition: A | Discharge status: Home / Self Care | Payer: Self-pay | Attending: Emergency Medicine | Admitting: Emergency Medicine

## 2020-12-08 DIAGNOSIS — Z7952 Long term (current) use of systemic steroids: Secondary | ICD-10-CM | POA: Insufficient documentation

## 2020-12-08 DIAGNOSIS — J40 Bronchitis, not specified as acute or chronic: Secondary | ICD-10-CM | POA: Insufficient documentation

## 2020-12-08 DIAGNOSIS — R440 Auditory hallucinations: Secondary | ICD-10-CM | POA: Insufficient documentation

## 2020-12-08 DIAGNOSIS — F17210 Nicotine dependence, cigarettes, uncomplicated: Secondary | ICD-10-CM | POA: Insufficient documentation

## 2020-12-08 DIAGNOSIS — J45909 Unspecified asthma, uncomplicated: Secondary | ICD-10-CM | POA: Insufficient documentation

## 2020-12-08 DIAGNOSIS — Z91040 Latex allergy status: Secondary | ICD-10-CM | POA: Insufficient documentation

## 2020-12-08 DIAGNOSIS — R070 Pain in throat: Secondary | ICD-10-CM | POA: Insufficient documentation

## 2020-12-08 MED ORDER — LIDOCAINE VISCOUS HCL 2 % MT SOLN
15.0000 mL | Freq: Once | OROMUCOSAL | Status: AC
  Administered 2020-12-08: 15 mL via ORAL
  Filled 2020-12-08: qty 15

## 2020-12-08 MED ORDER — PREDNISONE 20 MG PO TABS: 40.0000 mg | ORAL_TABLET | Freq: Every day | ORAL | 0 refills | Status: AC

## 2020-12-08 MED ORDER — ALUM & MAG HYDROXIDE-SIMETH 200-200-20 MG/5ML PO SUSP
30.0000 mL | Freq: Once | ORAL | Status: AC
  Administered 2020-12-08: 30 mL via ORAL
  Filled 2020-12-08: qty 30

## 2020-12-08 MED ORDER — PREDNISONE 20 MG PO TABS: 40.0000 mg | ORAL_TABLET | Freq: Every day | ORAL | 0 refills | Status: DC

## 2020-12-08 NOTE — ED Provider Notes (Signed)
[facility] EMERGENCY DEPARTMENT Provider Note   CSN: [id number] Arrival date & time: 12/08/20  9323     History Chief Complaint  Patient presents with  . Shortness of Breath    Amy Rowe is a 45 y.o. female with history of asthma, anxiety, hernia, major depressive disorder.  Presents today with chief complaint of shortness of breath.  Patient reports that she has had shortness of breath intermittently for the last week.  Patient reports shortness of breath is worse when she is coughing.  Patient reports relief with her albuterol inhaler.  Patient endorses productive cough.  Producing clear to yellow mucus. Patient is a smoker smoking 5 to 10 cigarettes/day.  Patient reports that she has cut back smoking cigarettes yesterday due to her shortness of breath.    Patient also endorses burning sensation in her throat.  Patient reports that this has been intermittent for "a long time."  She reports that she feels like she has reflux whenever she coughs.  Patient also reports reflux 1 eating food.  Patient has no chest pain.  She has no difficulty swallowing, no drooling, or no change in voice.  Patient denies any fevers, chills, sore throat, nasal congestion, rhinorrhea, loss of smell or taste, chest pain, palpitations, abdominal pain, nausea, vomiting, syncope,, lightheadedness, headaches.  Patient denies any recent surgery, cancer treatment, hemoptysis, unilateral leg swelling or tenderness, hormone therapy, prolonged immobilization.  Patient endorses auditory hallucinations.  Patient has been taking her medication as prescribed.  These voices are not commanding to hurt or harm herself or others.  Patient denies any visual hallucinations.  Patient denies any suicidal ideations or homicidal ideations.   HPI     Past Medical History:  Diagnosis Date  . Anxiety   . Asthma   . Bronchitis   . Depression     Patient Active Problem List   Diagnosis Date Noted  . Schizophrenia  spectrum disorder with psychotic disorder type not yet determined (HCC) 10/07/2020  . MDD (major depressive disorder) 08/13/2015  . Major depressive disorder, recurrent episode (HCC) 08/13/2015    Past Surgical History:  Procedure Laterality Date  . CESAREAN SECTION       OB History   No obstetric history on file.     History reviewed. No pertinent family history.  Social History   Tobacco Use  . Smoking status: Current Every Day Smoker    Types: Cigarettes  . Smokeless tobacco: Never Used  Substance Use Topics  . Alcohol use: Yes    Comment: socially  . Drug use: No    Home Medications Prior to Admission medications   Medication Sig Start Date End Date Taking? Authorizing Provider  albuterol (VENTOLIN HFA) 108 (90 Base) MCG/ACT inhaler Inhale 2 puffs into the lungs every 6 (six) hours as needed for wheezing or shortness of breath. 10/13/20   Laveda Abbe, NP  benztropine (COGENTIN) 0.5 MG tablet Take 1 tablet (0.5 mg total) by mouth 2 (two) times daily as needed for tremors (dystonia). 10/13/20   Laveda Abbe, NP  citalopram (CELEXA) 20 MG tablet Take 1 tablet (20 mg total) by mouth daily. 10/13/20   Laveda Abbe, NP  fluticasone [facility]) 50 MCG/ACT nasal spray Place 1 spray into both nostrils daily as needed for allergies or rhinitis. 10/13/20   Laveda Abbe, NP  OLANZapine (ZYPREXA) 20 MG tablet Take 1 tablet (20 mg total) by mouth at bedtime. 10/13/20   Laveda Abbe, NP  traZODone (DESYREL) 100  MG tablet Take 1 tablet (100 mg total) by mouth at bedtime as needed for sleep (x1 repeat PRN). 10/13/20   Laveda Abbe, NP    Allergies    Other and Latex  Review of Systems   Review of Systems  Constitutional: Negative for chills and fever.  HENT: Negative for congestion, rhinorrhea, sinus pressure and sore throat.   Eyes: Negative for visual disturbance.  Respiratory: Positive for cough and shortness of breath.   Cardiovascular:  Negative for chest pain, palpitations and leg swelling.  Gastrointestinal: Negative for abdominal pain, nausea and vomiting.  Genitourinary: Negative for difficulty urinating, dysuria, frequency and hematuria.  Musculoskeletal: Negative for back pain and neck pain.  Skin: Negative for color change and rash.  Neurological: Negative for dizziness, syncope, weakness, light-headedness and headaches.  Psychiatric/Behavioral: Positive for hallucinations. Negative for confusion, self-injury and suicidal ideas.    Physical Exam Updated Vital Signs BP 114/65   Pulse 64   Temp 98.3 F (36.8 C) (Oral)   Resp 17   Ht 5\' 3"  (1.6 m)   Wt 89.8 kg   SpO2 100%   BMI 35.07 kg/m   Physical Exam Vitals and nursing note reviewed.  Constitutional:      General: She is not in acute distress.    Appearance: She is not ill-appearing, toxic-appearing or diaphoretic.  HENT:     Head: Normocephalic and atraumatic.  Eyes:     General: No scleral icterus.       Right eye: No discharge.        Left eye: No discharge.  Cardiovascular:     Rate and Rhythm: Normal rate.     Heart sounds: Normal heart sounds.  Pulmonary:     Effort: Pulmonary effort is normal. No tachypnea, bradypnea or respiratory distress.     Breath sounds: Normal breath sounds. No stridor. No decreased breath sounds, wheezing, rhonchi or rales.  Abdominal:     General: There is no distension. There are no signs of injury.     Palpations: Abdomen is soft. There is no mass or pulsatile mass.     Tenderness: There is no abdominal tenderness. There is no guarding or rebound.     Hernia: There is no hernia in the umbilical area or ventral area.  Musculoskeletal:     Cervical back: Normal range of motion and neck supple.     Right lower leg: No tenderness. No edema.     Left lower leg: No tenderness. No edema.  Skin:    General: Skin is warm and dry.     Coloration: Skin is not cyanotic.  Neurological:     General: No focal deficit  present.     Mental Status: She is alert.  Psychiatric:        Behavior: Behavior is cooperative.     ED Results / Procedures / Treatments   Labs (all labs ordered are listed, but only abnormal results are displayed) Labs Reviewed - No data to display  EKG None  Radiology DG Chest 2 View  Result Date: 12/08/2020 CLINICAL DATA:  Cough, chest pain and shortness of breath EXAM: CHEST - 2 VIEW COMPARISON:  Prior chest x-ray 01/29/2009 FINDINGS: The lungs are clear and negative for focal airspace consolidation, pulmonary edema or suspicious pulmonary nodule. No pleural effusion or pneumothorax. Cardiac and mediastinal contours are within normal limits. No acute fracture or lytic or blastic osseous lesions. The visualized upper abdominal bowel gas pattern is unremarkable. IMPRESSION: No active cardiopulmonary disease.  Electronically Signed   By: Malachy Moan M.D.   On: 12/08/2020 11:26    Procedures Procedures   Medications Ordered in ED Medications  alum & mag hydroxide-simeth (MAALOX/MYLANTA) 200-200-20 MG/5ML suspension 30 mL (30 mLs Oral Given 12/08/20 0957)    And  lidocaine (XYLOCAINE) 2 % viscous mouth solution 15 mL (15 mLs Oral Given 12/08/20 0957)    ED Course  I have reviewed the triage vital signs and the nursing notes.  Pertinent labs & imaging results that were available during my care of the patient were reviewed by me and considered in my medical decision making (see chart for details).    MDM Rules/Calculators/A&P                          Alert 45 year old female no acute distress, nontoxic-appearing.  Patient oxygen saturation 97% or greater on room air.  Patient able to speak in full complete sentences without difficulty.  Patient presents with chief complaint of productive cough.    Patient denies any fevers, chills, sore throat, nasal congestion, rhinorrhea, loss of smell or taste, chest pain, palpitations, abdominal pain, nausea, vomiting, syncope,,  lightheadedness, headaches.  Lungs clear to auscultation bilaterally, no swelling or tenderness to bilateral lower extremities.  Low suspicion for PE as patient is negative per PERC criteria.  We will obtain chest x-ray to evaluate for pneumonia.  Chest x-ray shows no active acute cardiopulmonary disease.  Patient also has history of schizophrenia.  Currently on Zyprexa, trazodone, and citalopram.  She endorses auditory hallucinations.  Denies that these voices are commanding her to hurt herself or others.  Patient denies any visual hallucinations, suicidal ideations or homicidal ideations.  Patient does not appear to be responding to internal stimuli.  Patient is not a risk to herself or others.  Will have patient reach out to her psychiatric provider for follow-up.  Symptoms likely due to bronchitis and/or asthma exacerbation.  Patient will be started on 5-day course of prednisone.  Patient advised to stop smoking.  Discussed results, findings, treatment and follow up. Patient advised of return precautions. Patient verbalized understanding and agreed with plan.   Final Clinical Impression(s) / ED Diagnoses Final diagnoses:  Bronchitis    Rx / DC Orders ED Discharge Orders         Ordered    predniSONE (DELTASONE) 20 MG tablet  Daily,   Status:  Discontinued        12/08/20 1148    predniSONE (DELTASONE) 20 MG tablet  Daily        12/08/20 [address] 12/08/20 2117    Bethann Berkshire, MD 12/10/20 [phone]

## 2020-12-08 NOTE — Discharge Instructions (Addendum)
Came to the emergency department today to be evaluated for your cough.  Your physical exam was reassuring.  Your chest x-ray showed no signs of an infection.  Your symptoms are likely due to bronchitis.  To this I have given you a 5-day course of prednisone.  Continue to use your albuterol inhaler as needed for shortness of breath.  Follow-up with your primary care provider if your symptoms do not improve.  Please follow-up with your psychiatric care provider for any concerns regarding your schizophrenia.  Return to the emergency department immediately if you have any thoughts of wanting to hurt yourself or others.  Get help right away if you: Cough up blood. Feel pain in your chest. Have severe shortness of breath. Faint or keep feeling like you are going to faint. Have a severe headache. Have fever or chills that get worse.

## 2020-12-08 NOTE — ED Triage Notes (Signed)
Patient complaining of SOB for the past two weeks with burning sensation in chest. Reports productive cough. States that she has hx of asthma and dx of Covid in Jan. And feels like her asthma has been worse since then.

## 2021-05-20 IMAGING — CT CT HEAD W/O CM
3 series · 16 of 47 positions shown, 19 images · non-contrast
Comparison: None.

CLINICAL DATA: Psychosis

EXAM:
CT HEAD WITHOUT CONTRAST
TECHNIQUE: Contiguous axial images were obtained from the base of the skull
through the vertex without intravenous contrast.

[Series 2: head w o · axial · 0.47mm/px · z∈[+12,+142]mm · 10 of 32 slices shown, 13 images]
[im 3/32  brain]
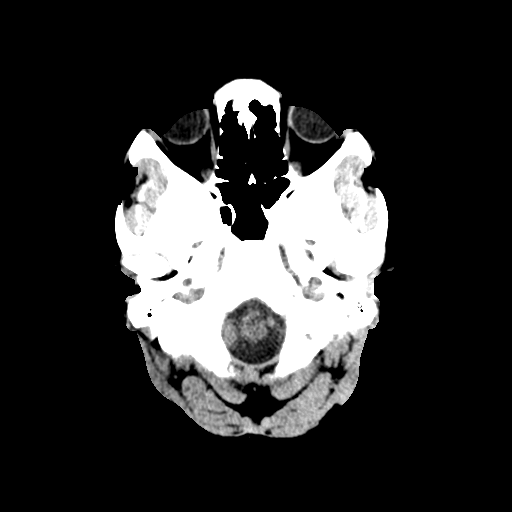
[im 3/32  bone]
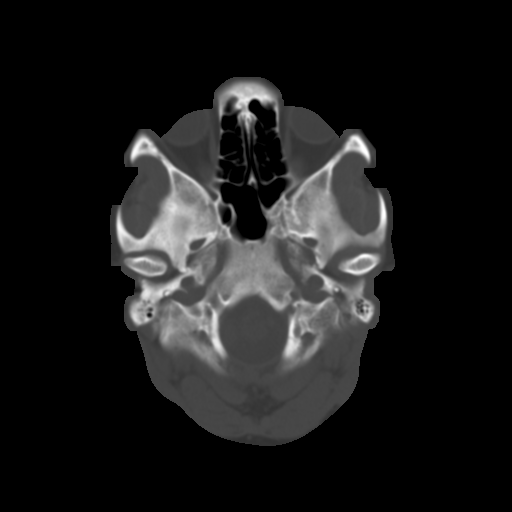
[im 6/32  brain]
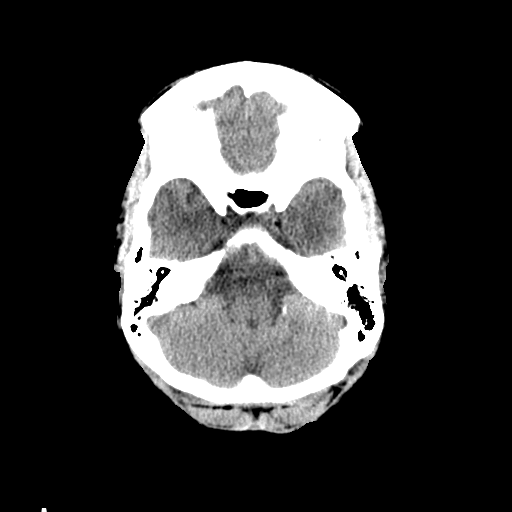
[im 9/32  brain]
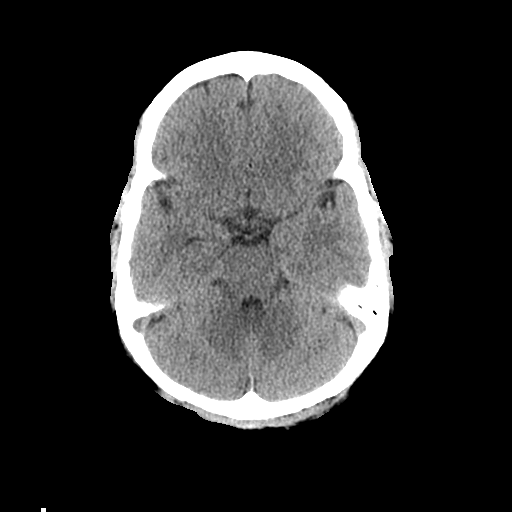
[im 11/32  brain]
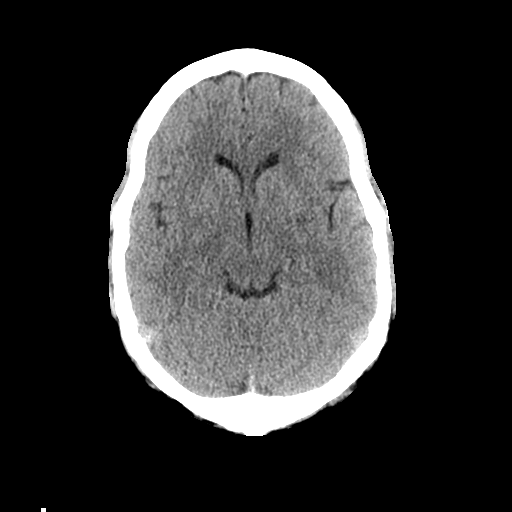
[im 14/32  brain]
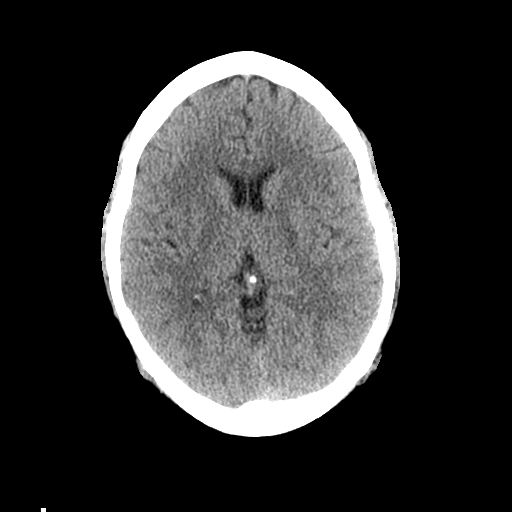
[im 14/32  bone]
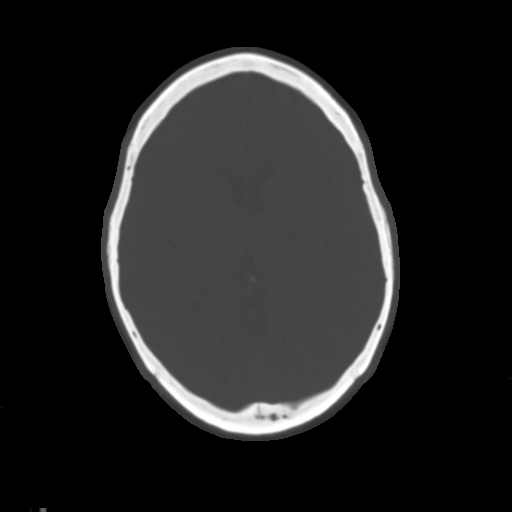
[im 18/32  brain]
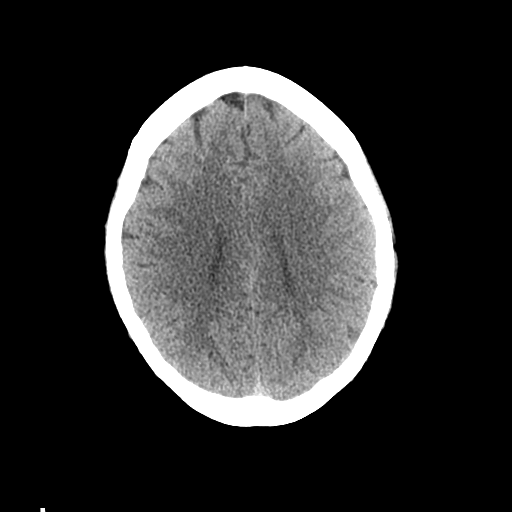
[im 21/32  brain]
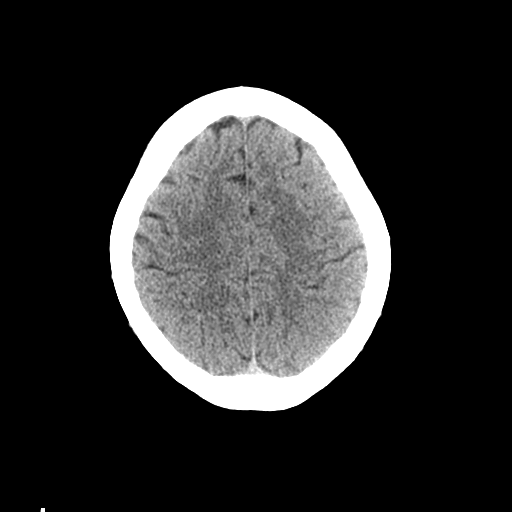
[im 24/32  brain]
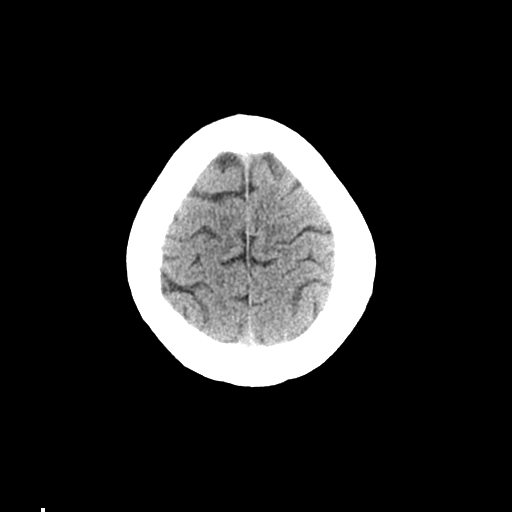
[im 26/32  brain]
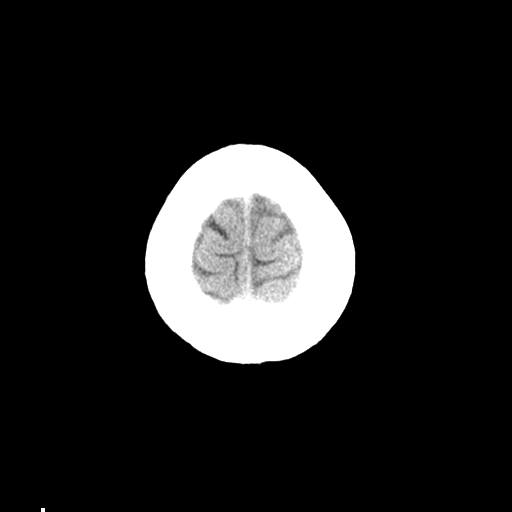
[im 26/32  bone]
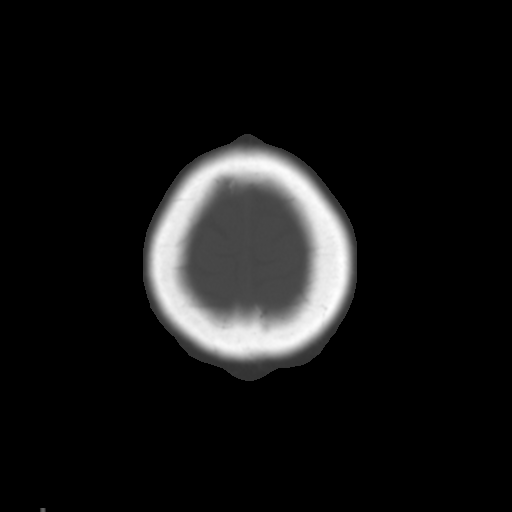
[im 29/32  brain]
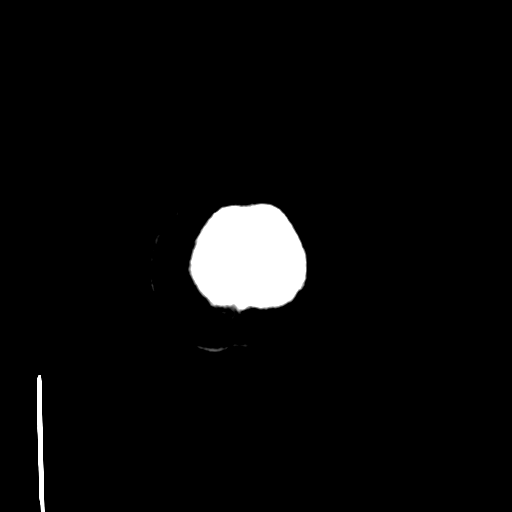

[Series 4: coronal soft · coronal · 0.35mm/px · 3 of 66 slices shown]
[im 22/66  brain]
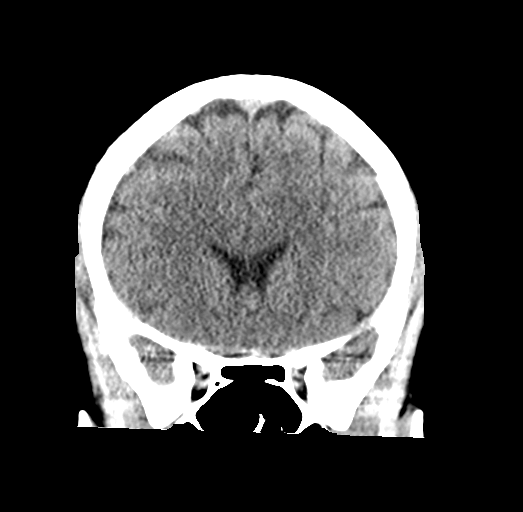
[im 29/66  brain]
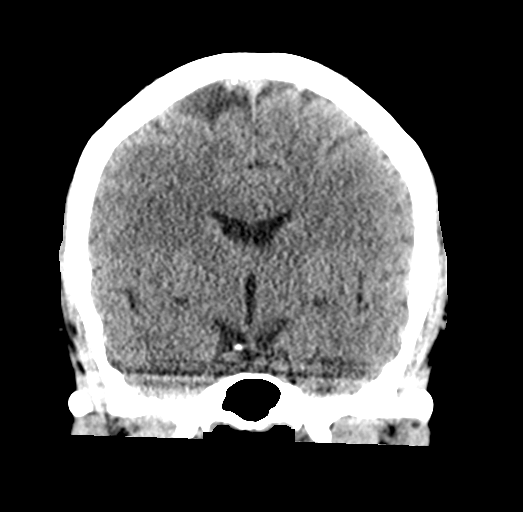
[im 37/66  brain]
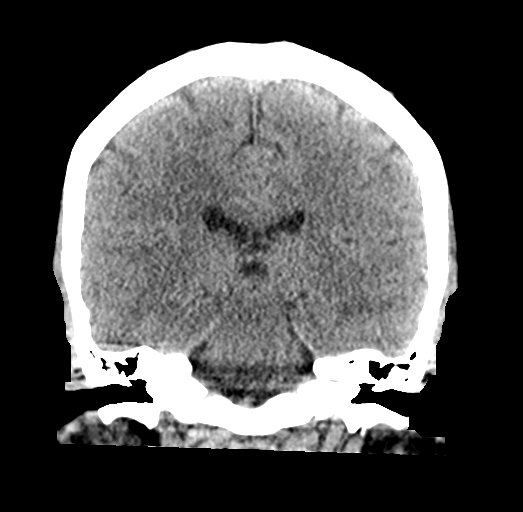

[Series 5: sagittal soft · sagittal · 0.36mm/px · 3 of 58 slices shown]
[im 20/58  brain]
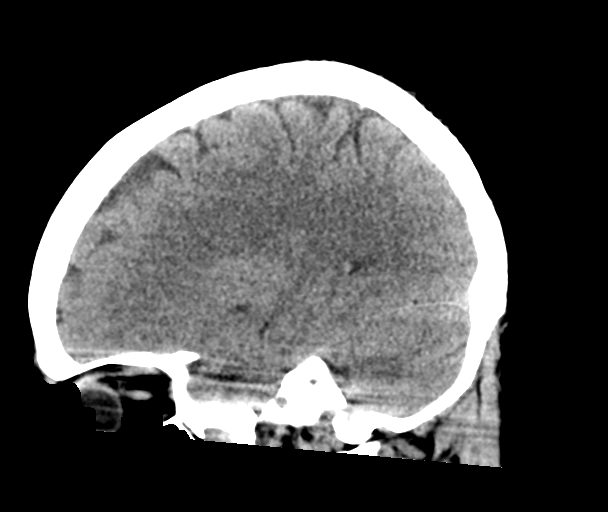
[im 29/58  brain]
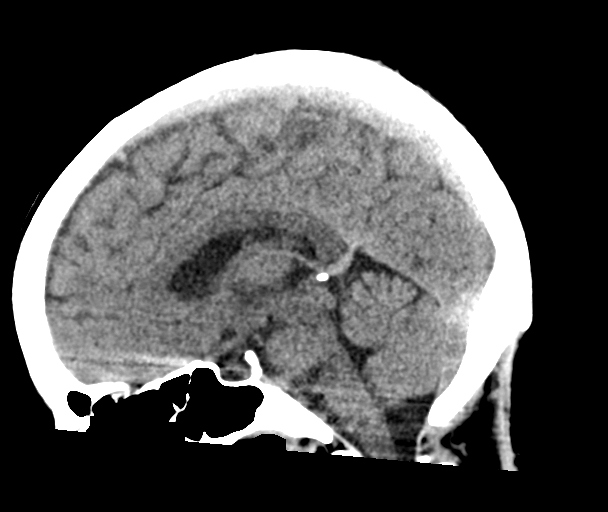
[im 39/58  brain]
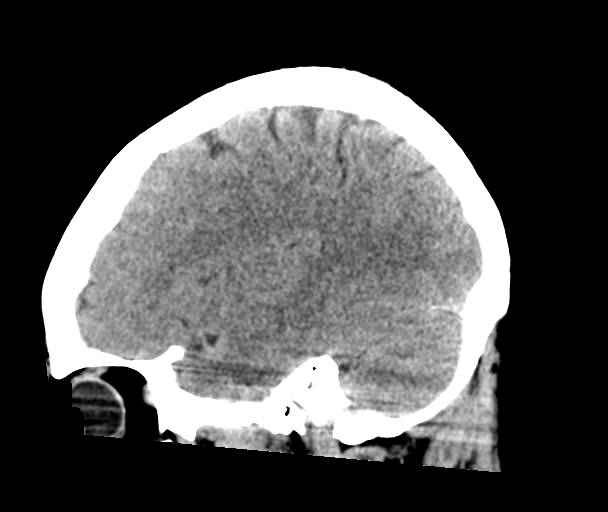

[16 of 47 positions shown; findings below may reference images not displayed]

FINDINGS: Brain: No acute territorial infarction, hemorrhage or intracranial
mass. The ventricles are nonenlarged.

Vascular: No hyperdense vessel or unexpected calcification.

Skull: Normal. Negative for fracture or focal lesion.

Sinuses/Orbits: No acute finding.

Other: None
IMPRESSION: Negative non contrasted CT appearance of the brain.

## 2021-08-04 ENCOUNTER — Encounter (HOSPITAL_COMMUNITY): Payer: Self-pay

## 2021-08-04 ENCOUNTER — Emergency Department (HOSPITAL_COMMUNITY): Payer: Self-pay

## 2021-08-04 ENCOUNTER — Other Ambulatory Visit: Payer: Self-pay

## 2021-08-04 ENCOUNTER — Observation Stay (HOSPITAL_COMMUNITY)
Admission: EM | Admit: 2021-08-04 | Discharge: 2021-08-05 | Disposition: A | Discharge status: Home / Self Care | Payer: Self-pay | Attending: Family Medicine | Admitting: Family Medicine

## 2021-08-04 DIAGNOSIS — Z720 Tobacco use: Secondary | ICD-10-CM | POA: Diagnosis present

## 2021-08-04 DIAGNOSIS — F339 Major depressive disorder, recurrent, unspecified: Secondary | ICD-10-CM | POA: Diagnosis present

## 2021-08-04 DIAGNOSIS — F209 Schizophrenia, unspecified: Secondary | ICD-10-CM | POA: Diagnosis present

## 2021-08-04 DIAGNOSIS — F29 Unspecified psychosis not due to a substance or known physiological condition: Secondary | ICD-10-CM | POA: Diagnosis present

## 2021-08-04 DIAGNOSIS — R0902 Hypoxemia: Secondary | ICD-10-CM

## 2021-08-04 DIAGNOSIS — E876 Hypokalemia: Secondary | ICD-10-CM | POA: Insufficient documentation

## 2021-08-04 DIAGNOSIS — F17210 Nicotine dependence, cigarettes, uncomplicated: Secondary | ICD-10-CM | POA: Insufficient documentation

## 2021-08-04 DIAGNOSIS — J441 Chronic obstructive pulmonary disease with (acute) exacerbation: Secondary | ICD-10-CM | POA: Diagnosis present

## 2021-08-04 DIAGNOSIS — R0602 Shortness of breath: Secondary | ICD-10-CM

## 2021-08-04 DIAGNOSIS — Z20822 Contact with and (suspected) exposure to covid-19: Secondary | ICD-10-CM | POA: Insufficient documentation

## 2021-08-04 DIAGNOSIS — Z91040 Latex allergy status: Secondary | ICD-10-CM | POA: Insufficient documentation

## 2021-08-04 DIAGNOSIS — J45901 Unspecified asthma with (acute) exacerbation: Secondary | ICD-10-CM | POA: Insufficient documentation

## 2021-08-04 DIAGNOSIS — J101 Influenza due to other identified influenza virus with other respiratory manifestations: Principal | ICD-10-CM | POA: Insufficient documentation

## 2021-08-04 DIAGNOSIS — J9601 Acute respiratory failure with hypoxia: Secondary | ICD-10-CM | POA: Insufficient documentation

## 2021-08-04 DIAGNOSIS — J09X1 Influenza due to identified novel influenza A virus with pneumonia: Secondary | ICD-10-CM | POA: Diagnosis present

## 2021-08-04 DIAGNOSIS — F329 Major depressive disorder, single episode, unspecified: Secondary | ICD-10-CM | POA: Diagnosis present

## 2021-08-04 LAB — BASIC METABOLIC PANEL
Anion gap: 11 (ref 5–15)
BUN: 10 mg/dL (ref 6–20)
CO2: 17 mmol/L — ABNORMAL LOW (ref 22–32)
Calcium: 8.7 mg/dL — ABNORMAL LOW (ref 8.9–10.3)
Chloride: 107 mmol/L (ref 98–111)
Creatinine, Ser: 0.89 mg/dL (ref 0.44–1.00)
GFR, Estimated: >60 mL/min (ref >60)
Glucose, Bld: 192 mg/dL — ABNORMAL HIGH (ref 70–99)
Potassium: 3.2 mmol/L — ABNORMAL LOW (ref 3.5–5.1)
Sodium: 135 mmol/L (ref 135–145)

## 2021-08-04 LAB — RESP PANEL BY RT-PCR (FLU A&B, COVID) ARPGX2
Influenza A by PCR: POSITIVE — AB
Influenza B by PCR: NEGATIVE
SARS Coronavirus 2 by RT PCR: NEGATIVE

## 2021-08-04 LAB — CBC
HCT: 39.8 % (ref 36.0–46.0)
Hemoglobin: 12.7 g/dL (ref 12.0–15.0)
MCH: 28.8 pg (ref 26.0–34.0)
MCHC: 31.9 g/dL (ref 30.0–36.0)
MCV: 90.2 fL (ref 80.0–100.0)
Platelets: 164 10*3/uL (ref 150–400)
RBC: 4.41 MIL/uL (ref 3.87–5.11)
RDW: 15.1 % (ref 11.5–15.5)
WBC: 5.4 10*3/uL (ref 4.0–10.5)
nRBC: 0 % (ref 0.0–0.2)

## 2021-08-04 LAB — MAGNESIUM: Magnesium: 2.5 mg/dL — ABNORMAL HIGH (ref 1.7–2.4)

## 2021-08-04 MED ORDER — METHYLPREDNISOLONE SODIUM SUCC 125 MG IJ SOLR: 125.0000 mg | Freq: Once | INTRAMUSCULAR | Status: DC

## 2021-08-04 MED ORDER — ACETAMINOPHEN 325 MG PO TABS
650.0000 mg | ORAL_TABLET | Freq: Once | ORAL | Status: AC | PRN
  Administered 2021-08-04: 650 mg via ORAL

## 2021-08-04 MED ORDER — IPRATROPIUM-ALBUTEROL 0.5-2.5 (3) MG/3ML IN SOLN
3.0000 mL | Freq: Once | RESPIRATORY_TRACT | Status: AC
  Administered 2021-08-04: 3 mL via RESPIRATORY_TRACT

## 2021-08-04 MED ORDER — ONDANSETRON HCL 4 MG/2ML IJ SOLN: 4.0000 mg | Freq: Four times a day (QID) | INTRAMUSCULAR | Status: DC | PRN

## 2021-08-04 MED ORDER — GUAIFENESIN-DM 100-10 MG/5ML PO SYRP
10.0000 mL | ORAL_SOLUTION | Freq: Three times a day (TID) | ORAL | Status: DC
  Administered 2021-08-04 – 2021-08-05 (×2): 10 mL via ORAL
  Filled 2021-08-04: qty 10

## 2021-08-04 MED ORDER — POTASSIUM CHLORIDE CRYS ER 20 MEQ PO TBCR
40.0000 meq | EXTENDED_RELEASE_TABLET | Freq: Once | ORAL | Status: AC
  Administered 2021-08-04: 40 meq via ORAL

## 2021-08-04 MED ORDER — OLANZAPINE 5 MG PO TABS
10.0000 mg | ORAL_TABLET | Freq: Every day | ORAL | Status: DC
  Administered 2021-08-04: 10 mg via ORAL

## 2021-08-04 MED ORDER — METHYLPREDNISOLONE SODIUM SUCC 125 MG IJ SOLR
60.0000 mg | Freq: Two times a day (BID) | INTRAMUSCULAR | Status: DC
  Administered 2021-08-05: 60 mg via INTRAVENOUS
  Filled 2021-08-04: qty 2

## 2021-08-04 MED ORDER — CITALOPRAM HYDROBROMIDE 20 MG PO TABS
20.0000 mg | ORAL_TABLET | Freq: Every day | ORAL | Status: DC
  Administered 2021-08-05: 20 mg via ORAL
  Filled 2021-08-04: qty 1

## 2021-08-04 MED ORDER — POLYETHYLENE GLYCOL 3350 17 G PO PACK: 17.0000 g | PACK | Freq: Every day | ORAL | Status: DC | PRN

## 2021-08-04 MED ORDER — ACETAMINOPHEN 650 MG RE SUPP: 650.0000 mg | Freq: Four times a day (QID) | RECTAL | Status: DC | PRN

## 2021-08-04 MED ORDER — ALBUTEROL SULFATE (2.5 MG/3ML) 0.083% IN NEBU
2.5000 mg | INHALATION_SOLUTION | Freq: Four times a day (QID) | RESPIRATORY_TRACT | Status: AC
  Administered 2021-08-05 (×2): 2.5 mg via RESPIRATORY_TRACT
  Filled 2021-08-04 (×2): qty 3

## 2021-08-04 MED ORDER — ACETAMINOPHEN 325 MG PO TABS: 650.0000 mg | ORAL_TABLET | Freq: Four times a day (QID) | ORAL | Status: DC | PRN

## 2021-08-04 MED ORDER — MAGNESIUM SULFATE 2 GM/50ML IV SOLN
2.0000 g | Freq: Once | INTRAVENOUS | Status: AC
  Administered 2021-08-04: 2 g via INTRAVENOUS

## 2021-08-04 MED ORDER — ALBUTEROL SULFATE (2.5 MG/3ML) 0.083% IN NEBU
2.5000 mg | INHALATION_SOLUTION | RESPIRATORY_TRACT | Status: DC | PRN
  Administered 2021-08-04: 2.5 mg via RESPIRATORY_TRACT

## 2021-08-04 MED ORDER — ONDANSETRON HCL 4 MG PO TABS: 4.0000 mg | ORAL_TABLET | Freq: Four times a day (QID) | ORAL | Status: DC | PRN

## 2021-08-04 MED ORDER — ALBUTEROL (5 MG/ML) CONTINUOUS INHALATION SOLN
10.0000 mg/h | INHALATION_SOLUTION | Freq: Once | RESPIRATORY_TRACT | Status: AC
  Administered 2021-08-04: 10 mg/h via RESPIRATORY_TRACT

## 2021-08-04 MED ORDER — OSELTAMIVIR PHOSPHATE 75 MG PO CAPS
75.0000 mg | ORAL_CAPSULE | Freq: Two times a day (BID) | ORAL | Status: DC
  Administered 2021-08-04 – 2021-08-05 (×2): 75 mg via ORAL
  Filled 2021-08-04: qty 1

## 2021-08-04 MED ORDER — LACTATED RINGERS IV BOLUS
1000.0000 mL | Freq: Once | INTRAVENOUS | Status: AC
  Administered 2021-08-04: 1000 mL via INTRAVENOUS

## 2021-08-04 MED ORDER — POTASSIUM CHLORIDE IN NACL 40-0.9 MEQ/L-% IV SOLN: INTRAVENOUS | Status: AC

## 2021-08-04 MED ORDER — ENOXAPARIN SODIUM 40 MG/0.4ML IJ SOSY
40.0000 mg | PREFILLED_SYRINGE | INTRAMUSCULAR | Status: DC
  Administered 2021-08-04: 40 mg via SUBCUTANEOUS

## 2021-08-04 NOTE — ED Triage Notes (Signed)
BIB EMS due to shortness of breath and wheezing.  Was given albuterol, solumedrol and zofran en route.  Expiratory wheezing heard will patient sitting on stretcher.

## 2021-08-04 NOTE — ED Provider Notes (Signed)
[facility] EMERGENCY DEPARTMENT Provider Note   CSN: [id number] Arrival date & time: 08/04/21  [id number]     History Chief Complaint  Patient presents with   Shortness of Breath    Amy Rowe is a 45 y.o. female with a history of asthma, anxiety, MDD and schizophrenia presents today via EMS with chief complaint of fevers and shortness of breath.  Symptoms started about 3 days ago and have been worsening since onset.  Patient arrives to ED with temperature of 103 F.  She also complains of nonproductive cough with subsequent chest pain and sore throat.  Also endorses headache. she has been using her at home nebulizer, most recently used last night, without relief.  She notes that she ran out of her albuterol inhaler a while ago.  She has not been taking anything for her fevers.  On route, EMS administered albuterol, 125 Solu-Medrol and Zofran.  She denies abdomen pain, nausea, vomiting, diarrhea.   Patient denies history of DVT, recent hospitalization or recent surgery.  Shortness of Breath Associated symptoms: cough, fever, headaches, sore throat and vomiting   Associated symptoms: no abdominal pain and no chest pain       Past Medical History:  Diagnosis Date   Anxiety    Asthma    Bronchitis    Depression     Patient Active Problem List   Diagnosis Date Noted   Schizophrenia spectrum disorder with psychotic disorder type not yet determined ([place]) 10/07/2020   MDD (major depressive disorder) 08/13/2015   Major depressive disorder, recurrent episode ([place]) 08/13/2015    Past Surgical History:  Procedure Laterality Date   CESAREAN SECTION       OB History   No obstetric history on file.     No family history on file.  Social History   Tobacco Use   Smoking status: Every Day    Types: Cigarettes   Smokeless tobacco: Never  Vaping Use   Vaping Use: Every day  Substance Use Topics   Alcohol use: Yes    Comment: socially   Drug use: No    Home  Medications Prior to Admission medications   Medication Sig Start Date End Date Taking? Authorizing Provider  albuterol (VENTOLIN HFA) 108 (90 Base) MCG/ACT inhaler Inhale 2 puffs into the lungs every 6 (six) hours as needed for wheezing or shortness of breath. 10/13/20   Ethelene Hal, NP  benztropine (COGENTIN) 0.5 MG tablet Take 1 tablet (0.5 mg total) by mouth 2 (two) times daily as needed for tremors (dystonia). 10/13/20   Ethelene Hal, NP  citalopram (CELEXA) 20 MG tablet Take 1 tablet (20 mg total) by mouth daily. 10/13/20   Ethelene Hal, NP  fluticasone [facility]) 50 MCG/ACT nasal spray Place 1 spray into both nostrils daily as needed for allergies or rhinitis. 10/13/20   Ethelene Hal, NP  OLANZapine (ZYPREXA) 20 MG tablet Take 1 tablet (20 mg total) by mouth at bedtime. 10/13/20   Ethelene Hal, NP  traZODone (DESYREL) 100 MG tablet Take 1 tablet (100 mg total) by mouth at bedtime as needed for sleep (x1 repeat PRN). 10/13/20   Ethelene Hal, NP    Allergies    Other and Latex  Review of Systems   Review of Systems  Constitutional:  Positive for appetite change and fever. Negative for chills and fatigue.  HENT:  Positive for sore throat.   Eyes:  Negative for redness and visual disturbance.  Respiratory:  Positive  for cough and shortness of breath. Negative for chest tightness.   Cardiovascular:  Negative for chest pain.  Gastrointestinal:  Positive for vomiting. Negative for abdominal pain and diarrhea.  Endocrine: Negative.   Genitourinary: Negative.   Musculoskeletal:  Positive for myalgias.  Skin: Negative.   Neurological:  Positive for headaches.  Psychiatric/Behavioral: Negative.    All other systems reviewed and are negative.  Physical Exam Updated Vital Signs BP 137/72   Pulse (!) 108   Temp 99.8 F (37.7 C) (Oral)   Resp (!) 29   Ht 5\' 3"  (1.6 m)   Wt 94.8 kg   LMP  (LMP Unknown)   SpO2 96%   BMI 37.02 kg/m   Physical  Exam Vitals and nursing note reviewed.  Constitutional:      General: She is in acute distress.     Appearance: She is ill-appearing.  HENT:     Head: Atraumatic.     Nose: Nose normal.  Eyes:     Extraocular Movements: Extraocular movements intact.     Conjunctiva/sclera: Conjunctivae normal.  Cardiovascular:     Rate and Rhythm: Regular rhythm. Tachycardia present.     Pulses: Normal pulses.          Radial pulses are 2+ on the right side and 2+ on the left side.       Dorsalis pedis pulses are 2+ on the right side and 2+ on the left side.     Heart sounds: No murmur heard.    Comments: Tachycardic.  Heart sounds normal.  +2 pulses symmetric and bilaterally in the distal upper and lower extremities Pulmonary:     Effort: Accessory muscle usage present. No respiratory distress.     Breath sounds: Wheezing present.     Comments: Patient has diffuse expiratory wheezing in all lung fields.  Appears in mild respiratory distress.  Tachypneic with accessory muscle usage.  No rales or rhonchi auscultated. Abdominal:     General: Abdomen is flat. There is no distension.     Palpations: Abdomen is soft.     Tenderness: There is no abdominal tenderness.     Comments: Nontender to palpation.  Musculoskeletal:        General: Normal range of motion.     Cervical back: Normal range of motion.  Skin:    General: Skin is warm and dry.     Capillary Refill: Capillary refill takes less than 2 seconds.  Neurological:     General: No focal deficit present.     Mental Status: She is alert.  Psychiatric:        Mood and Affect: Mood normal.    ED Results / Procedures / Treatments   Labs (all labs ordered are listed, but only abnormal results are displayed) Labs Reviewed  RESP PANEL BY RT-PCR (FLU A&B, COVID) ARPGX2 - Abnormal; Notable for the following components:      Result Value   Influenza A by PCR POSITIVE (*)    All other components within normal limits     EKG None  Radiology DG Chest Port 1 View  Result Date: 08/04/2021 CLINICAL DATA:  Shortness of breath and weakness EXAM: PORTABLE CHEST 1 VIEW COMPARISON:  Radiograph 12/08/2020 FINDINGS: Unchanged cardiomediastinal silhouette. There is no new focal airspace disease. There is no pleural effusion or visible pneumothorax. There is no acute osseous abnormality. IMPRESSION: No evidence of acute cardiopulmonary disease. Electronically Signed   By: 02/07/2021 M.D.   On: 08/04/2021 10:41  Procedures Procedures   Medications Ordered in ED Medications  lactated ringers bolus 1,000 mL (has no administration in time range)  magnesium sulfate IVPB 2 g 50 mL (has no administration in time range)  acetaminophen (TYLENOL) tablet 650 mg (650 mg Oral Given 08/04/21 1022)  ipratropium-albuterol (DUONEB) 0.5-2.5 (3) MG/3ML nebulizer solution 3 mL (3 mLs Nebulization Given 08/04/21 1112)  albuterol (PROVENTIL,VENTOLIN) solution continuous neb (10 mg/hr Nebulization Given 08/04/21 1206)    ED Course  I have reviewed the triage vital signs and the nursing notes.  Pertinent labs & imaging results that were available during my care of the patient were reviewed by me and considered in my medical decision making (see chart for details).  Clinical Course as of 08/04/21 1507  Tue Aug 04, 2021  1054 DG Chest [place] 1 View Chest x-ray negative for signs of pneumonia or infiltrate [EC]  1459 ED EKG [EC]    Clinical Course User Index [EC] Rodena Piety   MDM Rules/Calculators/A&P                         Initial impression: Presents with respiratory symptoms and shortness of breath via EMS.  She was given 125 mg Solu-Medrol, albuterol and Zofran in route, but had audible expiratory wheezing on exam with accessory muscle usage.  Patient started and 2 L O2 nasal cannula.  On arrival, temperature of 103 F, respiratory rate of 24.  Respiratory distress slightly improved administration of 2 L O2,  however continues to have diffuse expiratory wheezing.  She was given 1 DuoNeb with only mild improvement so I started her on continuous albuterol.   Workup: No cardiopulmonary disease seen on chest x-ray.  Patient was flu positive.  BMP and CBC pending.  Reassessment: On reassessment following DuoNeb and continuous albuterol, patient does seem slightly more comfortable however continues to have slight wheezing.  Furthermore while at rest she has oxygen sats between 89% and 93% on room air.  When walking in ED, her stats were dropping down to 88% and 89%.  She was given 1 L lactated ringer bolus 2 mg magnesium. However, given her new oxygen requirement and the fact that she failed Duoneb, continuous albuterol and solumedrol, patient will likely need to be admitted.  Rule-out: Low suspicion for pneumonia given clear CXR.  With flu positive respiratory panel, my suspicion for systemic bacterial infection is quite low at this point.  I considered possible DVT/PE, however patient is currently PERC negative, no lower extremity swelling, no hemoptysis.  Consult: Spoke with Dr. Denton Brick who agrees to admit patient  Plan: Patient will be admitted for further evaluation and treatment.  Disposition ultimately decided by admitting physician.  All labs and imaging interpreted independently by myself Kathe Becton, PA-C  Final Clinical Impression(s) / ED Diagnoses Final diagnoses:  Influenza A  Hypoxia    Rx / DC Orders ED Discharge Orders     None        Rodena Piety 08/04/21 1649    Milton Ferguson, MD 08/05/21 1650

## 2021-08-04 NOTE — ED Notes (Signed)
Ambulated on pulse ox and started at 94% and desat to 89%

## 2021-08-04 NOTE — ED Notes (Signed)
RT notified of breathing treatment.

## 2021-08-04 NOTE — ED Notes (Signed)
RT notified of continuous breathing treatment ordered

## 2021-08-04 NOTE — H&P (Signed)
History and Physical    Amy Rowe [id number] DOB: October 05, 1975 DOA: 08/04/2021  PCP: Pcp, No   Patient coming from: Home  I have personally briefly reviewed patient's old medical records in [facility]  Chief Complaint: Difficulty breathing  HPI: Amy Rowe is a 45 y.o. female with medical history significant for asthma, tobacco use, anxiety, depression, schizophrenia. Patient presented to the ED with complaints of difficulty breathing and dry cough that started yesterday, with wheezing.  She was at work when symptoms started, and significantly worsened when she tried to walk home.  No chest pain no leg swelling.  She smokes 1 to 2 packs of cigarettes daily.  No improvement in symptoms with her home breathing treatments.  Reports 1 episode of vomiting today.  She did not get the flu vaccine. EMS gave 125 mg of Solu-Medrol in route.  ED Course: On arrival patient is tachypneic with accessory muscle use, diffuse expiratory wheezing.  T-max 103.  Heart rate 87-107.  Respiratory rate 17-34.  Blood pressure 120s to 130s.  O2 sats 90 to 99% on room air, with ambulation O2 sats dropped to 89% on room air.  K- 3.2.  WBC 5.4.  UA without evidence of acute abnormality. Breathing improved with duo nebs, and albuterol nebs given in ED, 1 L bolus also given and magnesium 2 g.  But with ambulatory hypoxia hospitalist to admit.  Review of Systems: As per HPI all other systems reviewed and negative.  Past Medical History:  Diagnosis Date   Anxiety    Asthma    Bronchitis    Depression     Past Surgical History:  Procedure Laterality Date   CESAREAN SECTION       reports that she has been smoking cigarettes. She has never used smokeless tobacco. She reports current alcohol use. She reports that she does not use drugs.  Allergies  Allergen Reactions   Other     ANTIBIOTIC-Name is unknown. States that reaction occurred along time ago that included having an infection after    Latex Swelling and Rash    Family history of hypertension.  Prior to Admission medications   Medication Sig Start Date End Date Taking? Authorizing Provider  albuterol (VENTOLIN HFA) 108 (90 Base) MCG/ACT inhaler Inhale 2 puffs into the lungs every 6 (six) hours as needed for wheezing or shortness of breath. 10/13/20  Yes Laveda Abbe, NP  citalopram (CELEXA) 20 MG tablet Take 1 tablet (20 mg total) by mouth daily. 10/13/20  Yes Laveda Abbe, NP  ePHEDrine-guaiFENesin (PRIMATENE ASTHMA PO) Take 1 tablet by mouth daily as needed.   Yes [provider]  fluticasone (FLONASE) 50 MCG/ACT nasal spray Place 1 spray into both nostrils daily as needed for allergies or rhinitis. 10/13/20  Yes Laveda Abbe, NP  OLANZapine (ZYPREXA) 10 MG tablet Take 10 mg by mouth at bedtime. 07/06/21  Yes [provider]  traZODone (DESYREL) 100 MG tablet Take 1 tablet (100 mg total) by mouth at bedtime as needed for sleep (x1 repeat PRN). 10/13/20  Yes Laveda Abbe, NP  benztropine (COGENTIN) 0.5 MG tablet Take 1 tablet (0.5 mg total) by mouth 2 (two) times daily as needed for tremors (dystonia). Patient not taking: Reported on 08/04/2021 10/13/20   Laveda Abbe, NP  OLANZapine (ZYPREXA) 20 MG tablet Take 1 tablet (20 mg total) by mouth at bedtime. Patient not taking: Reported on 08/04/2021 10/13/20   Laveda Abbe, NP  Physical Exam: Vitals:   08/04/21 1230 08/04/21 1330 08/04/21 1430 08/04/21 1530  BP: 134/73 137/72 130/70 127/71  Pulse: 95 (!) 108 (!) 107 92  Resp: (!) 26 (!) 29 17 (!) 34  Temp:      TempSrc:      SpO2: 99% 96% 93% 92%  Weight:      Height:        Constitutional: NAD, calm, comfortable Vitals:   08/04/21 1230 08/04/21 1330 08/04/21 1430 08/04/21 1530  BP: 134/73 137/72 130/70 127/71  Pulse: 95 (!) 108 (!) 107 92  Resp: (!) 26 (!) 29 17 (!) 34  Temp:      TempSrc:      SpO2: 99% 96% 93% 92%  Weight:      Height:        Eyes: Pupils equal lids and conjunctivae normal ENMT: Mucous membranes are moist. Neck: normal, supple, no masses, no thyromegaly Respiratory: Faint and sparse expiratory wheezing, no crackles.  Normal work of breathing.  No accessory muscle use. Cardiovascular: Regular rate and rhythm, no murmurs / rubs / gallops. No extremity edema. 2+ pedal pulses.  Abdomen: no tenderness, no masses palpated. No hepatosplenomegaly. Bowel sounds positive.  Musculoskeletal: no clubbing / cyanosis. No joint deformity upper and lower extremities.  Skin: no rashes, lesions, ulcers. No induration Neurologic: No apparent cranial nerve abnormality, moving extremities spontaneously. Psychiatric: Normal judgment and insight. Alert and oriented x 3. Normal mood.   Labs on Admission: I have personally reviewed following labs and imaging studies  CBC: Recent Labs  Lab 08/04/21 1532  WBC 5.4  HGB 12.7  HCT 39.8  MCV 90.2  PLT 164   Basic Metabolic Panel: Recent Labs  Lab 08/04/21 1532  NA 135  K 3.2*  CL 107  CO2 17*  GLUCOSE 192*  BUN 10  CREATININE 0.89  CALCIUM 8.7*    Radiological Exams on Admission: DG Chest Port 1 View  Result Date: 08/04/2021 CLINICAL DATA:  Shortness of breath and weakness EXAM: PORTABLE CHEST 1 VIEW COMPARISON:  Radiograph 12/08/2020 FINDINGS: Unchanged cardiomediastinal silhouette. There is no new focal airspace disease. There is no pleural effusion or visible pneumothorax. There is no acute osseous abnormality. IMPRESSION: No evidence of acute cardiopulmonary disease. Electronically Signed   By: Caprice Renshaw M.D.   On: 08/04/2021 10:41    EKG: Pending.   Assessment/Plan Principal Problem:   Asthma exacerbation Active Problems:   MDD (major depressive disorder)   Major depressive disorder, recurrent episode ([facility])   Schizophrenia spectrum disorder with psychotic disorder type not yet determined ([facility])   Tobacco abuse   Influenza A  Asthma exacerbation 2/2  influenza A infection with acute hypoxic respiratory failure-dyspnea, cough, wheezing.  Febrile to 103.  Chest x-ray without acute cardiopulmonary abnormality.  O2 sats down to 89% on room air with ambulation.  -125 mg Solu-Medrol given in route by EMS, continue 60 BID -Tamiflu - Albuterol nebulizer as needed and scheduled -1 L bolus given, continue N/s + 40 kcl 100cc/hr x 10hrs - CBG in a.m while on steroids  Hypokalemia-3.2.   - Check magnesium. -Replete  Tobacco use-smokes 1 to 2 packs of cigarettes daily. - Counselled extensively on need to quit smoking. She will consider quitting.  Depression, schizophrenia, anxiety-stable. -Resume olanzapine and Celexa  DVT prophylaxis: Lovenox Code Status: Full code Family Communication: None at bedside Disposition Plan: ~ 1 day, pending need for supplemental oxygen.  Likely home tomorrow Consults called: none Admission status:  Obs tele  Onnie Boer MD [facility]  08/04/2021, 4:51 PM

## 2021-08-05 LAB — BASIC METABOLIC PANEL
Anion gap: 6 (ref 5–15)
BUN: 11 mg/dL (ref 6–20)
CO2: 20 mmol/L — ABNORMAL LOW (ref 22–32)
Calcium: 8.8 mg/dL — ABNORMAL LOW (ref 8.9–10.3)
Chloride: 109 mmol/L (ref 98–111)
Creatinine, Ser: 0.78 mg/dL (ref 0.44–1.00)
GFR, Estimated: >60 mL/min (ref >60)
Glucose, Bld: 109 mg/dL — ABNORMAL HIGH (ref 70–99)
Potassium: 4.6 mmol/L (ref 3.5–5.1)
Sodium: 135 mmol/L (ref 135–145)

## 2021-08-05 LAB — GLUCOSE, CAPILLARY: Glucose-Capillary: 129 mg/dL — ABNORMAL HIGH (ref 70–99)

## 2021-08-05 MED ORDER — ALBUTEROL SULFATE (2.5 MG/3ML) 0.083% IN NEBU: 2.5000 mg | INHALATION_SOLUTION | RESPIRATORY_TRACT | 2 refills | Status: AC | PRN

## 2021-08-05 MED ORDER — IPRATROPIUM-ALBUTEROL 0.5-2.5 (3) MG/3ML IN SOLN: 3.0000 mL | Freq: Once | RESPIRATORY_TRACT | Status: DC

## 2021-08-05 MED ORDER — ACETAMINOPHEN 325 MG PO TABS
650.0000 mg | ORAL_TABLET | Freq: Four times a day (QID) | ORAL | 0 refills | Status: DC | PRN
Start: 2021-08-05 — End: 2023-09-23

## 2021-08-05 MED ORDER — GUAIFENESIN ER 600 MG PO TB12: 600.0000 mg | ORAL_TABLET | Freq: Two times a day (BID) | ORAL | 0 refills | Status: AC

## 2021-08-05 MED ORDER — OSELTAMIVIR PHOSPHATE 75 MG PO CAPS
75.0000 mg | ORAL_CAPSULE | Freq: Two times a day (BID) | ORAL | 0 refills | Status: AC
Start: 2021-08-05 — End: 2021-08-10

## 2021-08-05 MED ORDER — PREDNISONE 20 MG PO TABS: 40.0000 mg | ORAL_TABLET | Freq: Every day | ORAL | 0 refills | Status: AC

## 2021-08-05 MED ORDER — ALBUTEROL SULFATE HFA 108 (90 BASE) MCG/ACT IN AERS: 2.0000 | INHALATION_SPRAY | Freq: Four times a day (QID) | RESPIRATORY_TRACT | 0 refills | Status: AC | PRN

## 2021-08-05 NOTE — Discharge Instructions (Signed)
1) complete abstinence from tobacco strongly advised----you may use over-the-counter nicotine patch to help you quit smoking 2) Tamiflu and albuterol inhaler as prescribed 3) please continue the rest of the medications including Zyprexa as previously prescribed by your provider

## 2021-08-05 NOTE — Plan of Care (Signed)

## 2021-08-05 NOTE — Discharge Summary (Signed)
Amy Rowe, is a 45 y.o. female  DOB [birth year]  MRN [id number].  Admission date:  08/04/2021  Admitting Physician  Onnie Boer, MD  Discharge Date:  08/05/2021   Primary MD  Pcp, No  Recommendations for primary care physician for things to follow:   1) complete abstinence from tobacco strongly advised----you may use over-the-counter nicotine patch to help you quit smoking 2) Tamiflu and albuterol inhaler as prescribed 3) please continue the rest of the medications including Zyprexa as previously prescribed by your provider   Admission Diagnosis  SOB (shortness of breath) [R06.02] Influenza A [J10.1] Hypoxia [R09.02] Asthma exacerbation [J45.901]   Discharge Diagnosis  SOB (shortness of breath) [R06.02] Influenza A [J10.1] Hypoxia [R09.02] Asthma exacerbation [J45.901]    Principal Problem:   Influenza A Active Problems:   Asthma exacerbation   Tobacco abuse   MDD (major depressive disorder)   Major depressive disorder, recurrent episode (HCC)   Schizophrenia spectrum disorder with psychotic disorder type not yet determined [facility])      Past Medical History:  Diagnosis Date   Anxiety    Asthma    Bronchitis    Depression     Past Surgical History:  Procedure Laterality Date   CESAREAN SECTION        HPI  from the history and physical done on the day of admission:    Chief Complaint: Difficulty breathing   HPI: Amy Rowe is a 45 y.o. female with medical history significant for asthma, tobacco use, anxiety, depression, schizophrenia. Patient presented to the ED with complaints of difficulty breathing and dry cough that started yesterday, with wheezing.  She was at work when symptoms started, and significantly worsened when she tried to walk home.  No chest pain no leg swelling.  She smokes 1 to 2 packs of cigarettes daily.  No improvement in symptoms with her  home breathing treatments.  Reports 1 episode of vomiting today.  She did not get the flu vaccine. EMS gave 125 mg of Solu-Medrol in route.   ED Course: On arrival patient is tachypneic with accessory muscle use, diffuse expiratory wheezing.  T-max 103.  Heart rate 87-107.  Respiratory rate 17-34.  Blood pressure 120s to 130s.  O2 sats 90 to 99% on room air, with ambulation O2 sats dropped to 89% on room air.  K- 3.2.  WBC 5.4.  UA without evidence of acute abnormality. Breathing improved with duo nebs, and albuterol nebs given in ED, 1 L bolus also given and magnesium 2 g.  But with ambulatory hypoxia hospitalist to admit.   Review of Systems: As per HPI all other systems reviewed and negative.     Hospital Course:   Asthma exacerbation 2/2 influenza A infection with acute hypoxic respiratory failure- -Chest x-ray without acute findings -Treated with IV steroids, Tamiflu bronchodilators and mucolytics -Overall much improved, hypoxia resolved, post ambulation O2 sats in the mid 90s on room air -Okay to discharge on p.o. prednisone, bronchodilators and Tamiflu   Hypokalemia- -Replaced and  normalized   Tobacco use-smokes almost 2 packs of cigarettes daily. -Smoking cessation strongly advised, may use OTC nicotine patch  Depression, schizophrenia, anxiety-stable. -Resume PTA olanzapine and Celexa   Disposition Plan: Home Discharge Condition: Stable, hypoxia resolved  Follow UP--- PCP as advised   Diet and Activity recommendation:  As advised  Discharge Instructions    Discharge Instructions     Call MD for:  difficulty breathing, headache or visual disturbances   Complete by: As directed    Call MD for:  persistant dizziness or light-headedness   Complete by: As directed    Call MD for:  persistant nausea and vomiting   Complete by: As directed    Call MD for:  severe uncontrolled pain   Complete by: As directed    Call MD for:  temperature >100.4   Complete by: As  directed    Diet - low sodium heart healthy   Complete by: As directed    Discharge instructions   Complete by: As directed    1) complete abstinence from tobacco strongly advised----you may use over-the-counter nicotine patch to help you quit smoking 2) Tamiflu and albuterol inhaler as prescribed 3) please continue the rest of the medications including Zyprexa as previously prescribed by your provider   Increase activity slowly   Complete by: As directed        Discharge Medications     Allergies as of 08/05/2021       Reactions   Other    ANTIBIOTIC-Name is unknown. States that reaction occurred along time ago that included having an infection after   Latex Swelling, Rash        Medication List     TAKE these medications    acetaminophen 325 MG tablet Commonly known as: TYLENOL Take 2 tablets (650 mg total) by mouth every 6 (six) hours as needed for mild pain (or Fever >/= 101).   albuterol 108 (90 Base) MCG/ACT inhaler Commonly known as: VENTOLIN HFA Inhale 2 puffs into the lungs every 6 (six) hours as needed for wheezing or shortness of breath.   benztropine 0.5 MG tablet Commonly known as: COGENTIN Take 1 tablet (0.5 mg total) by mouth 2 (two) times daily as needed for tremors (dystonia).   citalopram 20 MG tablet Commonly known as: CELEXA Take 1 tablet (20 mg total) by mouth daily.   fluticasone 50 MCG/ACT nasal spray Commonly known as: FLONASE Place 1 spray into both nostrils daily as needed for allergies or rhinitis.   guaiFENesin 600 MG 12 hr tablet Commonly known as: Mucinex Take 1 tablet (600 mg total) by mouth 2 (two) times daily for 10 days.   OLANZapine 10 MG tablet Commonly known as: ZYPREXA Take 10 mg by mouth at bedtime. What changed: Another medication with the same name was removed. Continue taking this medication, and follow the directions you see here.   oseltamivir 75 MG capsule Commonly known as: TAMIFLU Take 1 capsule (75 mg  total) by mouth 2 (two) times daily for 5 days.   predniSONE 20 MG tablet Commonly known as: DELTASONE Take 2 tablets (40 mg total) by mouth daily with breakfast for 5 days.   PRIMATENE ASTHMA PO Take 1 tablet by mouth daily as needed.   traZODone 100 MG tablet Commonly known as: DESYREL Take 1 tablet (100 mg total) by mouth at bedtime as needed for sleep (x1 repeat PRN).        Major procedures and Radiology Reports - PLEASE review detailed and  final reports for all details, in brief -   DG Chest Port 1 View  Result Date: 08/04/2021 CLINICAL DATA:  Shortness of breath and weakness EXAM: PORTABLE CHEST 1 VIEW COMPARISON:  Radiograph 12/08/2020 FINDINGS: Unchanged cardiomediastinal silhouette. There is no new focal airspace disease. There is no pleural effusion or visible pneumothorax. There is no acute osseous abnormality. IMPRESSION: No evidence of acute cardiopulmonary disease. Electronically Signed   By: Caprice Renshaw M.D.   On: 08/04/2021 10:41    Micro Results   Recent Results (from the past 240 hour(s))  Resp Panel by RT-PCR (Flu A&B, Covid) Nasopharyngeal Swab     Status: Abnormal   Collection Time: 08/04/21 10:16 AM   Specimen: Nasopharyngeal Swab; Nasopharyngeal(NP) swabs in vial transport medium  Result Value Ref Range Status   SARS Coronavirus 2 by RT PCR NEGATIVE NEGATIVE Final    Comment: (NOTE) SARS-CoV-2 target nucleic acids are NOT DETECTED.  The SARS-CoV-2 RNA is generally detectable in upper respiratory specimens during the acute phase of infection. The lowest concentration of SARS-CoV-2 viral copies this assay can detect is 138 copies/mL. A negative result does not preclude SARS-Cov-2 infection and should not be used as the sole basis for treatment or other patient management decisions. A negative result may occur with  improper specimen collection/handling, submission of specimen other than nasopharyngeal swab, presence of viral mutation(s) within  the areas targeted by this assay, and inadequate number of viral copies(<138 copies/mL). A negative result must be combined with clinical observations, patient history, and epidemiological information. The expected result is Negative.  Fact Sheet for Patients:  BloggerCourse.com  Fact Sheet for Healthcare Providers:  SeriousBroker.it  This test is no t yet approved or cleared by the [place] FDA and  has been authorized for detection and/or diagnosis of SARS-CoV-2 by FDA under an Emergency Use Authorization (EUA). This EUA will remain  in effect (meaning this test can be used) for the duration of the COVID-19 declaration under Section 564(b)(1) of the Act, 21 U.S.C.section 360bbb-3(b)(1), unless the authorization is terminated  or revoked sooner.       Influenza A by PCR POSITIVE (A) NEGATIVE Final   Influenza B by PCR NEGATIVE NEGATIVE Final    Comment: (NOTE) The Xpert Xpress SARS-CoV-2/FLU/RSV plus assay is intended as an aid in the diagnosis of influenza from Nasopharyngeal swab specimens and should not be used as a sole basis for treatment. Nasal washings and aspirates are unacceptable for Xpert Xpress SARS-CoV-2/FLU/RSV testing.  Fact Sheet for Patients: BloggerCourse.com  Fact Sheet for Healthcare Providers: SeriousBroker.it  This test is not yet approved or cleared by the [place] FDA and has been authorized for detection and/or diagnosis of SARS-CoV-2 by FDA under an Emergency Use Authorization (EUA). This EUA will remain in effect (meaning this test can be used) for the duration of the COVID-19 declaration under Section 564(b)(1) of the Act, 21 U.S.C. section 360bbb-3(b)(1), unless the authorization is terminated or revoked.  Performed at [facility], [address] [place], [place] [address]     Today   Subjective    Tris Howell today has no  new concerns Cough is improved -No wheezing at rest no shortness of breath at rest -No hypoxia at rest no hypoxia post ambulation          Patient has been seen and examined prior to discharge   Objective   Blood pressure 118/64, pulse (!) 58, temperature 97.7 F (36.5 C), temperature source Oral, resp. rate 18, height  5\' 3"  (1.6 m), weight 94.8 kg, SpO2 99 %.   Intake/Output Summary (Last 24 hours) at 08/05/2021 1051 Last data filed at 08/05/2021 0500 Gross per 24 hour  Intake 1530 ml  Output --  Net 1530 ml    Exam Gen:- Awake Alert, no acute distress  HEENT:- [place].AT, No sclera icterus Neck-Supple Neck,No JVD,.  Lungs-fair air movement bilaterally, no wheezing  CV- S1, S2 normal, regular Abd-  +ve B.Sounds, Abd Soft, No tenderness,    Extremity/Skin:- No  edema,   good pulses Psych-affect is appropriate, oriented x3 Neuro-no new focal deficits, no tremors    Data Review   CBC w Diff:  Lab Results  Component Value Date   WBC 5.4 08/04/2021   HGB 12.7 08/04/2021   HCT 39.8 08/04/2021   PLT 164 08/04/2021   LYMPHOPCT 35 10/03/2020   MONOPCT 6 10/03/2020   EOSPCT 2 10/03/2020   BASOPCT 1 10/03/2020    CMP:  Lab Results  Component Value Date   NA 135 08/05/2021   K 4.6 08/05/2021   CL 109 08/05/2021   CO2 20 (L) 08/05/2021   BUN 11 08/05/2021   CREATININE 0.78 08/05/2021   PROT 8.2 (H) 10/05/2020   ALBUMIN 4.3 10/05/2020   BILITOT 0.6 10/05/2020   ALKPHOS 55 10/05/2020   AST 22 10/05/2020   ALT 17 10/05/2020  .   Total Discharge time is about 33 minutes  10/07/2020 M.D on 08/05/2021 at 10:51 AM  Go to [email or web address] -  for contact info  [facility] - Office  [phone]

## 2021-08-05 NOTE — Progress Notes (Signed)
SATURATION QUALIFICATIONS: (This note is used to comply with regulatory documentation for home oxygen)  Patient Saturations on Room Air at Rest = 95%  Patient Saturations on Room Air while Ambulating = 94%  Patient Saturations on 2 Liters of oxygen while Ambulating = 95%  Please briefly explain why patient needs home oxygen:

## 2021-11-06 ENCOUNTER — Other Ambulatory Visit: Payer: Self-pay

## 2021-11-06 DIAGNOSIS — K0889 Other specified disorders of teeth and supporting structures: Secondary | ICD-10-CM | POA: Insufficient documentation

## 2021-11-06 DIAGNOSIS — J45901 Unspecified asthma with (acute) exacerbation: Secondary | ICD-10-CM | POA: Insufficient documentation

## 2021-11-06 NOTE — ED Triage Notes (Signed)
Right lower dental pain x 1 week, facial swelling.  ?

## 2021-11-07 ENCOUNTER — Emergency Department (HOSPITAL_COMMUNITY)
Admission: EM | Admit: 2021-11-07 | Discharge: 2021-11-07 | Disposition: A | Discharge status: Home / Self Care | Payer: Self-pay | Attending: Emergency Medicine | Admitting: Emergency Medicine

## 2021-11-07 DIAGNOSIS — K0889 Other specified disorders of teeth and supporting structures: Secondary | ICD-10-CM

## 2021-11-07 MED ORDER — AMOXICILLIN 250 MG PO CAPS
500.0000 mg | ORAL_CAPSULE | Freq: Once | ORAL | Status: AC
  Administered 2021-11-07: 500 mg via ORAL
  Filled 2021-11-07: qty 2

## 2021-11-07 MED ORDER — AMOXICILLIN 500 MG PO CAPS: 500.0000 mg | ORAL_CAPSULE | Freq: Three times a day (TID) | ORAL | 0 refills | Status: DC

## 2021-11-07 NOTE — Discharge Instructions (Signed)
Begin taking amoxicillin as prescribed. ? ?Take ibuprofen 600 mg every 6 hours as needed for pain. ? ?Follow-up with dentistry in the next 3 to 4 days. ?

## 2021-11-07 NOTE — ED Provider Notes (Signed)
?[facility] ?Provider Note ? ? ?CSN: [id number] ?Arrival date & time: 11/06/21  1926 ? ?  ? ?History ? ?Chief Complaint  ?Patient presents with  ? Dental Pain  ? ? ?Amy Rowe is a 46 y.o. female. ? ?Patient is a 46 year old female with history of asthma.  Patient presenting today with complaints of dental pain and right jaw swelling.  This has been worsening over the past 4 days.  She has an area where she had a prior filling and believes it may have fallen out.  She denies any difficulty breathing or swallowing.  She denies any throat swelling. ? ?The history is provided by the patient.  ?Dental Pain ?Location:  Lower ?Lower teeth location:  30/RL 1st molar ?Quality:  Constant ?Severity:  Moderate ?Onset quality:  Gradual ?Duration:  4 days ?Timing:  Constant ?Progression:  Worsening ?Chronicity:  New ? ?  ? ?Home Medications ?Prior to Admission medications   ?Medication Sig Start Date End Date Taking? Authorizing Provider  ?acetaminophen (TYLENOL) 325 MG tablet Take 2 tablets (650 mg total) by mouth every 6 (six) hours as needed for mild pain (or Fever >/= 101). 08/05/21   Shon Hale, MD  ?albuterol (PROVENTIL) (2.5 MG/3ML) 0.083% nebulizer solution Take 3 mLs (2.5 mg total) by nebulization every 4 (four) hours as needed for wheezing or shortness of breath. 08/05/21 08/05/22  Shon Hale, MD  ?albuterol (VENTOLIN HFA) 108 (90 Base) MCG/ACT inhaler Inhale 2 puffs into the lungs every 6 (six) hours as needed for wheezing or shortness of breath. 08/05/21   Shon Hale, MD  ?benztropine (COGENTIN) 0.5 MG tablet Take 1 tablet (0.5 mg total) by mouth 2 (two) times daily as needed for tremors (dystonia). ?Patient not taking: Reported on 08/04/2021 10/13/20   Laveda Abbe, NP  ?citalopram (CELEXA) 20 MG tablet Take 1 tablet (20 mg total) by mouth daily. 10/13/20   Laveda Abbe, NP  ?ePHEDrine-guaiFENesin [facility] ASTHMA PO) Take 1 tablet by mouth daily as needed.     [provider]  ?fluticasone (FLONASE) 50 MCG/ACT nasal spray Place 1 spray into both nostrils daily as needed for allergies or rhinitis. 10/13/20   Laveda Abbe, NP  ?OLANZapine (ZYPREXA) 10 MG tablet Take 10 mg by mouth at bedtime. 07/06/21   [provider]  ?traZODone (DESYREL) 100 MG tablet Take 1 tablet (100 mg total) by mouth at bedtime as needed for sleep (x1 repeat PRN). 10/13/20   Laveda Abbe, NP  ?   ? ?Allergies    ?Other and Latex   ? ?Review of Systems   ?Review of Systems  ?All other systems reviewed and are negative. ? ?Physical Exam ?Updated Vital Signs ?BP (!) 122/57 (BP Location: Right Arm)   Pulse (!) 56   Temp 97.8 ?F (36.6 ?C) (Oral)   SpO2 99%  ?Physical Exam ?Vitals and nursing note reviewed.  ?Constitutional:   ?   Appearance: Normal appearance.  ?HENT:  ?   Head: Normocephalic and atraumatic.  ?   Mouth/Throat:  ?   Mouth: Mucous membranes are moist.  ?   Pharynx: No posterior oropharyngeal erythema.  ?   Comments: There is mild swelling along the right mandible.  The right first molar has an area with significant decay.  There is surrounding gingival inflammation, but no frank abscess. ?Pulmonary:  ?   Effort: Pulmonary effort is normal.  ?Skin: ?   General: Skin is warm and dry.  ?Neurological:  ?  Mental Status: She is alert.  ? ? ?ED Results / Procedures / Treatments   ?Labs ?(all labs ordered are listed, but only abnormal results are displayed) ?Labs Reviewed - No data to display ? ?EKG ?None ? ?Radiology ?No results found. ? ?Procedures ?Procedures  ? ? ?Medications Ordered in ED ?Medications  ?amoxicillin (AMOXIL) capsule 500 mg (has no administration in time range)  ? ? ?ED Course/ Medical Decision Making/ A&P ? ?Patient presenting with dental pain/infection.  To be treated with amoxicillin and follow-up with dentistry. ? ?Final Clinical Impression(s) / ED Diagnoses ?Final diagnoses:  ?None  ? ? ?Rx / DC Orders ?ED Discharge Orders   ? ? None   ? ?  ? ? ?  ?Geoffery Lyons, MD ?11/07/21 0128 ? ?

## 2022-03-21 IMAGING — DX DG CHEST 1V PORT
1 series · 1 of 1 positions shown · non-contrast
Comparison: Radiograph 12/08/2020

CLINICAL DATA: Shortness of breath and weakness

EXAM:
PORTABLE CHEST 1 VIEW

[chest ap]
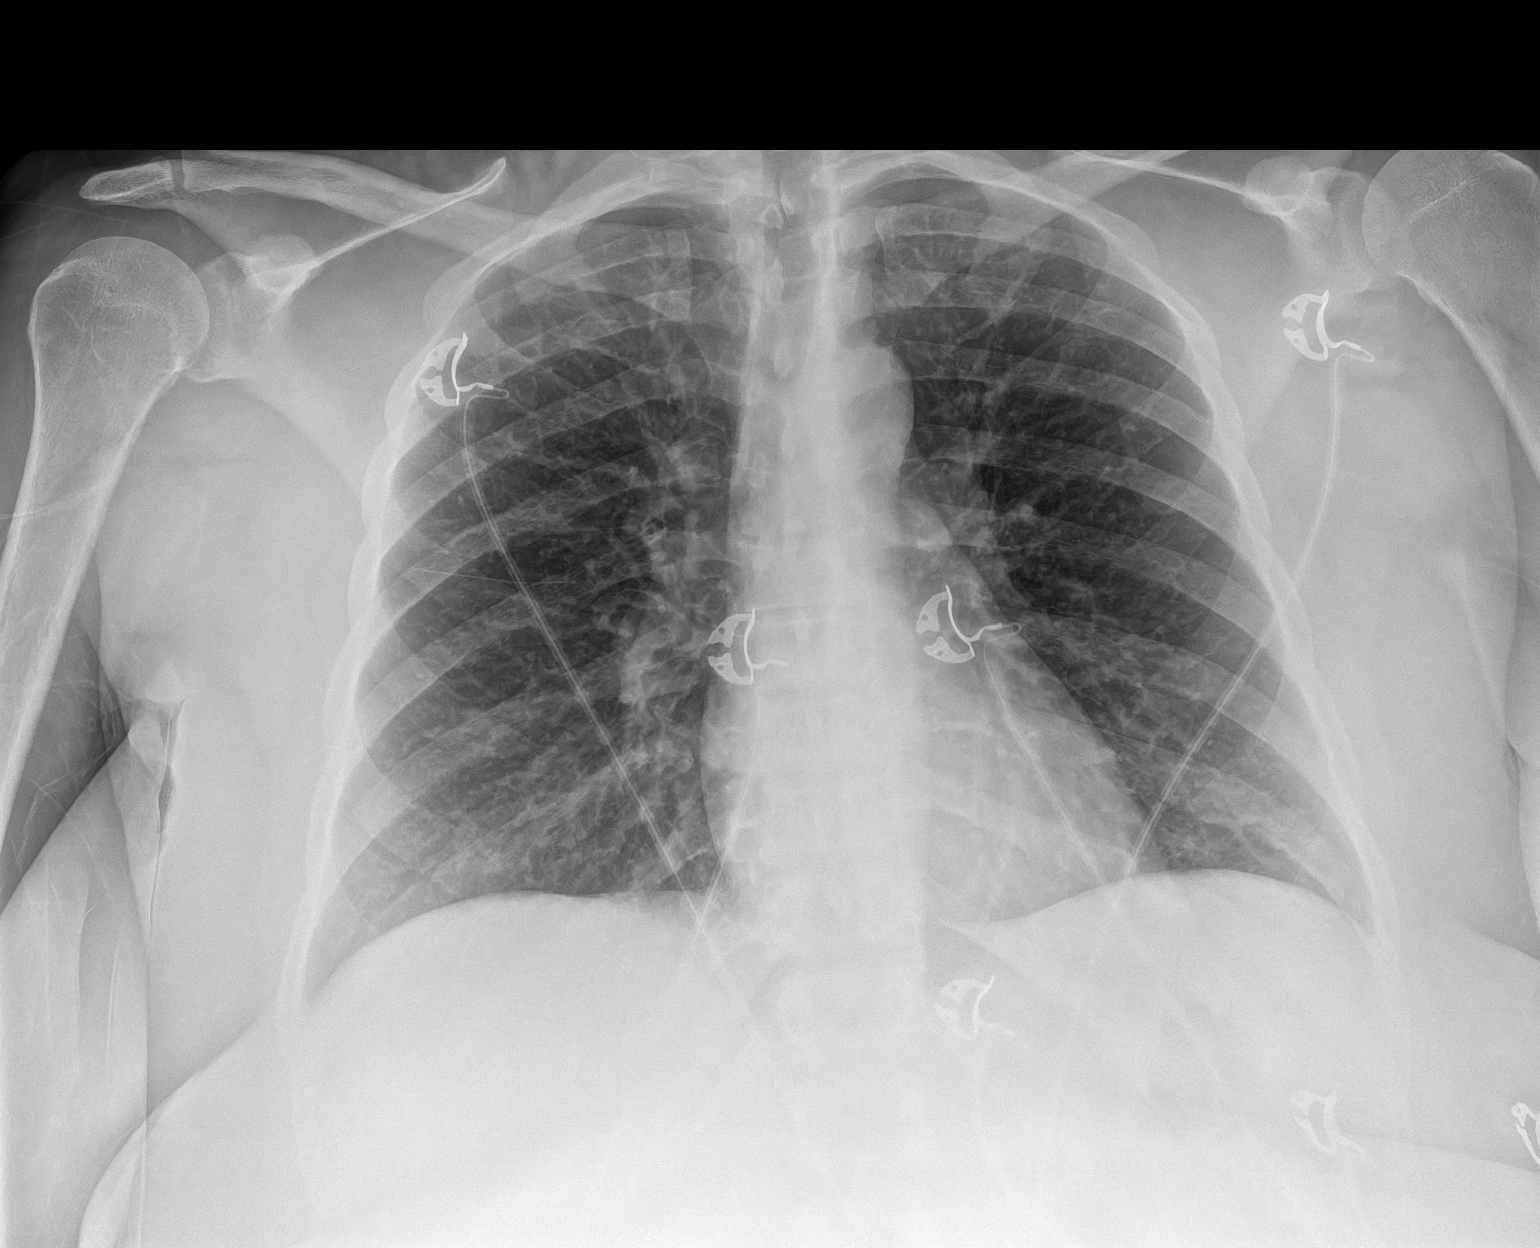

[1 of 1 positions shown; findings below may reference images not displayed]

FINDINGS: Unchanged cardiomediastinal silhouette. There is no new focal
airspace disease. There is no pleural effusion or visible
pneumothorax. There is no acute osseous abnormality.
IMPRESSION: No evidence of acute cardiopulmonary disease.

## 2022-12-21 ENCOUNTER — Ambulatory Visit
Admission: EM | Admit: 2022-12-21 | Discharge: 2022-12-21 | Disposition: A | Discharge status: Home / Self Care | Payer: Self-pay | Attending: Nurse Practitioner | Admitting: Nurse Practitioner

## 2022-12-21 ENCOUNTER — Encounter: Payer: Self-pay | Admitting: Emergency Medicine

## 2022-12-21 ENCOUNTER — Other Ambulatory Visit: Payer: Self-pay

## 2022-12-21 DIAGNOSIS — K0889 Other specified disorders of teeth and supporting structures: Secondary | ICD-10-CM

## 2022-12-21 DIAGNOSIS — R220 Localized swelling, mass and lump, head: Secondary | ICD-10-CM

## 2022-12-21 MED ORDER — KETOROLAC TROMETHAMINE 30 MG/ML IJ SOLN
30.0000 mg | Freq: Once | INTRAMUSCULAR | Status: AC
  Administered 2022-12-21: 30 mg via INTRAMUSCULAR

## 2022-12-21 MED ORDER — AMOXICILLIN-POT CLAVULANATE 875-125 MG PO TABS: 1.0000 | ORAL_TABLET | Freq: Two times a day (BID) | ORAL | 0 refills | Status: AC

## 2022-12-21 NOTE — ED Provider Notes (Signed)
[facility]    CSN: [id number] Arrival date & time: 12/21/22  1057      History   Chief Complaint Chief Complaint  Patient presents with   Facial Swelling    HPI Amy Rowe is a 47 y.o. female.   Patient presents today with 1 week history of left upper and right upper dental pain.  Reports she has multiple broken teeth inside her jaw and is aware she needs to establish care with a dentist, however has been unable to do so.  She reports over the past week, the left side of her face has become more and more swollen and painful.  No drainage inside the mouth.  No fevers or nausea/vomiting.  Has taken ibuprofen which seems to help with pain temporarily.  Patient denies allergies to antibiotic therapy; also denies antibiotic use in the past 90 days.    Past Medical History:  Diagnosis Date   Anxiety    Asthma    Bronchitis    Depression     Patient Active Problem List   Diagnosis Date Noted   Asthma exacerbation 08/04/2021   Tobacco abuse 08/04/2021   Influenza A 08/04/2021   Schizophrenia spectrum disorder with psychotic disorder type not yet determined 10/07/2020   MDD (major depressive disorder) 08/13/2015   Major depressive disorder, recurrent episode 08/13/2015    Past Surgical History:  Procedure Laterality Date   CESAREAN SECTION      OB History   No obstetric history on file.      Home Medications    Prior to Admission medications   Medication Sig Start Date End Date Taking? Authorizing Provider  amoxicillin-clavulanate (AUGMENTIN) 875-125 MG tablet Take 1 tablet by mouth 2 (two) times daily for 7 days. 12/21/22 12/28/22 Yes Valentino Nose, NP  acetaminophen (TYLENOL) 325 MG tablet Take 2 tablets (650 mg total) by mouth every 6 (six) hours as needed for mild pain (or Fever >/= 101). 08/05/21   Shon Hale, MD  albuterol (PROVENTIL) (2.5 MG/3ML) 0.083% nebulizer solution Take 3 mLs (2.5 mg total) by nebulization every 4 (four)  hours as needed for wheezing or shortness of breath. 08/05/21 08/05/22  Shon Hale, MD  albuterol (VENTOLIN HFA) 108 (90 Base) MCG/ACT inhaler Inhale 2 puffs into the lungs every 6 (six) hours as needed for wheezing or shortness of breath. 08/05/21   Shon Hale, MD  benztropine (COGENTIN) 0.5 MG tablet Take 1 tablet (0.5 mg total) by mouth 2 (two) times daily as needed for tremors (dystonia). Patient not taking: Reported on 08/04/2021 10/13/20   Laveda Abbe, NP  citalopram (CELEXA) 20 MG tablet Take 1 tablet (20 mg total) by mouth daily. 10/13/20   Laveda Abbe, NP  ePHEDrine-guaiFENesin (PRIMATENE ASTHMA PO) Take 1 tablet by mouth daily as needed. Patient not taking: Reported on 12/21/2022    [provider]  fluticasone (FLONASE) 50 MCG/ACT nasal spray Place 1 spray into both nostrils daily as needed for allergies or rhinitis. 10/13/20   Laveda Abbe, NP  OLANZapine (ZYPREXA) 10 MG tablet Take 10 mg by mouth at bedtime. 07/06/21   [provider]  traZODone (DESYREL) 100 MG tablet Take 1 tablet (100 mg total) by mouth at bedtime as needed for sleep (x1 repeat PRN). 10/13/20   Laveda Abbe, NP    Family History History reviewed. No pertinent family history.  Social History Social History   Tobacco Use   Smoking status: Former    Packs/day: 0.50  Years: 5.00    Additional pack years: 0.00    Total pack years: 2.50    Types: Cigarettes   Smokeless tobacco: Never  Vaping Use   Vaping Use: Former  Substance Use Topics   Alcohol use: Yes    Comment: socially   Drug use: No     Allergies   Other and Latex   Review of Systems Review of Systems Per HPI  Physical Exam Triage Vital Signs ED Triage Vitals  Enc Vitals Group     BP 12/21/22 1158 134/83     Pulse Rate 12/21/22 1158 79     Resp 12/21/22 1158 20     Temp 12/21/22 1158 98.2 F (36.8 C)     Temp Source 12/21/22 1158 Oral     SpO2 12/21/22 1158 95 %      Weight --      Height --      Head Circumference --      Peak Flow --      Pain Score 12/21/22 1155 8     Pain Loc --      Pain Edu? --      Excl. in GC? --    No data found.  Updated Vital Signs BP 134/83 (BP Location: Right Arm)   Pulse 79   Temp 98.2 F (36.8 C) (Oral)   Resp 20   LMP 12/04/2022 (Approximate)   SpO2 95%   Visual Acuity Right Eye Distance:   Left Eye Distance:   Bilateral Distance:    Right Eye Near:   Left Eye Near:    Bilateral Near:     Physical Exam Vitals and nursing note reviewed.  Constitutional:      Appearance: Normal appearance.  HENT:     Head: Normocephalic and atraumatic.     Mouth/Throat:     Dentition: Abnormal dentition. Dental tenderness, gingival swelling and dental caries present.     Tongue: No lesions.     Pharynx: Oropharynx is clear. Uvula midline. No pharyngeal swelling, oropharyngeal exudate or uvula swelling.     Tonsils: No tonsillar exudate.      Comments: Swelling noted to left upper jaw in area marked; left upper and right upper jaws are tender to palpation in areas marked Pulmonary:     Effort: Pulmonary effort is normal. No respiratory distress.  Skin:    General: Skin is warm and dry.     Coloration: Skin is not jaundiced or pale.     Findings: No erythema.  Neurological:     Mental Status: She is alert and oriented to person, place, and time.      UC Treatments / Results  Labs (all labs ordered are listed, but only abnormal results are displayed) Labs Reviewed - No data to display  EKG   Radiology No results found.  Procedures Procedures (including critical care time)  Medications Ordered in UC Medications  ketorolac (TORADOL) 30 MG/ML injection 30 mg (30 mg Intramuscular Given 12/21/22 1243)    Initial Impression / Assessment and Plan / UC Course  I have reviewed the triage vital signs and the nursing notes.  Pertinent labs & imaging results that were available during my care of the  patient were reviewed by me and considered in my medical decision making (see chart for details).   Patient is well-appearing, normotensive, afebrile, not tachycardic, not tachypneic, oxygenating well on room air.    1. Dentalgia 2. Gingival swelling Suspect dental abscess Will treat with Augmentin twice  daily for 7 days Pain treated with Toradol 30 mg IM today in urgent care; discussed not to take NSAIDs for the next 48 hours, can continue Tylenol 500 to 1000 mg every 6 hours as needed for pain Recommended follow-up with dentist and dental resources given  The patient was given the opportunity to ask questions.  All questions answered to their satisfaction.  The patient is in agreement to this plan.    Final Clinical Impressions(s) / UC Diagnoses   Final diagnoses:  Dentalgia  Gingival swelling     Discharge Instructions      Please take the Augmentin as prescribed to treat the dental infection.  We have given you a shot of Toradol today to help with the pain.  Do not take any ibuprofen for the next 48 hours.  You can take Tylenol 500 to 1000 mg every 6 hours as needed for pain in the meantime.  After 48 hours, you can resume the ibuprofen.  Recommend following up with any dental clinic-please see the attached resource guide and call the [facility] Department of community health.    ED Prescriptions     Medication Sig Dispense Auth. Provider   amoxicillin-clavulanate (AUGMENTIN) 875-125 MG tablet Take 1 tablet by mouth 2 (two) times daily for 7 days. 14 tablet Valentino Nose, NP      PDMP not reviewed this encounter.   Valentino Nose, NP 12/21/22 [phone]

## 2022-12-21 NOTE — Discharge Instructions (Signed)
Please take the Augmentin as prescribed to treat the dental infection.  We have given you a shot of Toradol today to help with the pain.  Do not take any ibuprofen for the next 48 hours.  You can take Tylenol 500 to 1000 mg every 6 hours as needed for pain in the meantime.  After 48 hours, you can resume the ibuprofen.  Recommend following up with any dental clinic-please see the attached resource guide and call the [facility] Department of community health.

## 2022-12-21 NOTE — ED Triage Notes (Signed)
Pt reports several broken teeth on left upper and right upper but reports left sided facial swelling to left side of face x1 week. Denies any known injury. Denies any fever, chills, nausea.

## 2023-08-22 ENCOUNTER — Encounter (HOSPITAL_COMMUNITY): Payer: Self-pay | Admitting: Licensed Clinical Social Worker

## 2023-08-22 ENCOUNTER — Ambulatory Visit (INDEPENDENT_AMBULATORY_CARE_PROVIDER_SITE_OTHER): Payer: Self-pay | Admitting: Licensed Clinical Social Worker

## 2023-08-22 ENCOUNTER — Telehealth: Payer: Self-pay | Admitting: Physician Assistant

## 2023-08-22 DIAGNOSIS — F29 Unspecified psychosis not due to a substance or known physiological condition: Secondary | ICD-10-CM

## 2023-08-22 DIAGNOSIS — F332 Major depressive disorder, recurrent severe without psychotic features: Secondary | ICD-10-CM

## 2023-08-22 MED ORDER — OLANZAPINE 10 MG PO TABS: 10.0000 mg | ORAL_TABLET | Freq: Every day | ORAL | 0 refills | Status: DC

## 2023-08-22 MED ORDER — CITALOPRAM HYDROBROMIDE 20 MG PO TABS: 20.0000 mg | ORAL_TABLET | Freq: Every day | ORAL | 0 refills | Status: DC

## 2023-08-22 MED ORDER — TRAZODONE HCL 100 MG PO TABS: 100.0000 mg | ORAL_TABLET | Freq: Every evening | ORAL | 0 refills | Status: DC | PRN

## 2023-08-22 NOTE — Progress Notes (Signed)
Virtual Visit Consent   Amy Rowe, you are scheduled for a virtual visit with a [facility] provider today. Just as with appointments in the office, your consent must be obtained to participate. Your consent will be active for this visit and any virtual visit you may have with one of our providers in the next 365 days. If you have a MyChart account, a copy of this consent can be sent to you electronically.  As this is a virtual visit, video technology does not allow for your provider to perform a traditional examination. This may limit your provider's ability to fully assess your condition. If your provider identifies any concerns that need to be evaluated in person or the need to arrange testing (such as labs, EKG, etc.), we will make arrangements to do so. Although advances in technology are sophisticated, we cannot ensure that it will always work on either your end or our end. If the connection with a video visit is poor, the visit may have to be switched to a telephone visit. With either a video or telephone visit, we are not always able to ensure that we have a secure connection.  By engaging in this virtual visit, you consent to the provision of healthcare and authorize for your insurance to be billed (if applicable) for the services provided during this visit. Depending on your insurance coverage, you may receive a charge related to this service.  I need to obtain your verbal consent now. Are you willing to proceed with your visit today? Amy Rowe has provided verbal consent on 08/22/2023 for a virtual visit (video or telephone). [name], PA-C  Date: 08/22/2023 4:55 PM  Virtual Visit via Video Note   I, [name], connected with  Amy Rowe  ([id number], June 30, 1976) on 08/22/23 at  4:45 PM EST by a video-enabled telemedicine application and verified that I am speaking with the correct person using two identifiers.  Location: Patient: Virtual Visit  Location Patient: Home Provider: Virtual Visit Location Provider: Home Office   I discussed the limitations of evaluation and management by telemedicine and the availability of in person appointments. The patient expressed understanding and agreed to proceed.    History of Present Illness: Amy Rowe is a 47 y.o. who identifies as a female who was assigned female at birth, and is being seen today for medication refill. Needing her behavioral health medications refilled until she is able to establish care with Behavioral Health on 10/04/23. She does continue to see her therapist, having most recent appointment today.   Problems:  Patient Active Problem List   Diagnosis Date Noted   Asthma exacerbation 08/04/2021   Tobacco abuse 08/04/2021   Influenza A 08/04/2021   Schizophrenia spectrum disorder with psychotic disorder type not yet determined (HCC) 10/07/2020   MDD (major depressive disorder) 08/13/2015   Major depressive disorder, recurrent episode (HCC) 08/13/2015    Allergies:  Allergies  Allergen Reactions   Other     ANTIBIOTIC-Name is unknown. States that reaction occurred along time ago that included having an infection after   Latex Swelling and Rash   Medications:  Current Outpatient Medications:    acetaminophen (TYLENOL) 325 MG tablet, Take 2 tablets (650 mg total) by mouth every 6 (six) hours as needed for mild pain (or Fever >/= 101)., Disp: 12 tablet, Rfl: 0   albuterol (PROVENTIL) (2.5 MG/3ML) 0.083% nebulizer solution, Take 3 mLs (2.5 mg total) by nebulization every 4 (four) hours as needed  for wheezing or shortness of breath., Disp: 75 mL, Rfl: 2   albuterol (VENTOLIN HFA) 108 (90 Base) MCG/ACT inhaler, Inhale 2 puffs into the lungs every 6 (six) hours as needed for wheezing or shortness of breath., Disp: 18 g, Rfl: 0   benztropine (COGENTIN) 0.5 MG tablet, Take 1 tablet (0.5 mg total) by mouth 2 (two) times daily as needed for tremors (dystonia). (Patient not  taking: Reported on 08/04/2021), Disp: 10 tablet, Rfl: 0   citalopram (CELEXA) 20 MG tablet, Take 1 tablet (20 mg total) by mouth daily., Disp: 90 tablet, Rfl: 0   ePHEDrine-guaiFENesin (PRIMATENE ASTHMA PO), Take 1 tablet by mouth daily as needed. (Patient not taking: Reported on 12/21/2022), Disp: , Rfl:    fluticasone (FLONASE) 50 MCG/ACT nasal spray, Place 1 spray into both nostrils daily as needed for allergies or rhinitis., Disp: 1 mL, Rfl: 0   OLANZapine (ZYPREXA) 10 MG tablet, Take 1 tablet (10 mg total) by mouth at bedtime., Disp: 90 tablet, Rfl: 0   traZODone (DESYREL) 100 MG tablet, Take 1 tablet (100 mg total) by mouth at bedtime as needed for sleep (x1 repeat PRN)., Disp: 90 tablet, Rfl: 0  Observations/Objective: Patient is well-developed, well-nourished in no acute distress.  Resting comfortably at home.  Head is normocephalic, atraumatic.  No labored breathing.  Speech is clear and coherent with logical content.  Patient is alert and oriented at baseline.    Assessment and Plan: 1. Schizophrenia spectrum disorder with psychotic disorder type not yet determined (HCC) (Primary) - OLANZapine (ZYPREXA) 10 MG tablet; Take 1 tablet (10 mg total) by mouth at bedtime.  Dispense: 90 tablet; Refill: 0 - citalopram (CELEXA) 20 MG tablet; Take 1 tablet (20 mg total) by mouth daily.  Dispense: 90 tablet; Refill: 0 - traZODone (DESYREL) 100 MG tablet; Take 1 tablet (100 mg total) by mouth at bedtime as needed for sleep (x1 repeat PRN).  Dispense: 90 tablet; Refill: 0  2. Severe episode of recurrent major depressive disorder, without psychotic features (HCC) - OLANZapine (ZYPREXA) 10 MG tablet; Take 1 tablet (10 mg total) by mouth at bedtime.  Dispense: 90 tablet; Refill: 0 - citalopram (CELEXA) 20 MG tablet; Take 1 tablet (20 mg total) by mouth daily.  Dispense: 90 tablet; Refill: 0 - traZODone (DESYREL) 100 MG tablet; Take 1 tablet (100 mg total) by mouth at bedtime as needed for sleep (x1  repeat PRN).  Dispense: 90 tablet; Refill: 0  - Medications refilled x 90 days - Patient made aware of 90-day one-time refill policy for Virtual Urgent Care; voiced understanding - Keep scheduled appt with [facility] on 10/04/23  Follow Up Instructions: I discussed the assessment and treatment plan with the patient. The patient was provided an opportunity to ask questions and all were answered. The patient agreed with the plan and demonstrated an understanding of the instructions.  A copy of instructions were sent to the patient via MyChart unless otherwise noted below.    The patient was advised to call back or seek an in-person evaluation if the symptoms worsen or if the condition fails to improve as anticipated.    [name], PA-C
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
## 2023-08-22 NOTE — Patient Instructions (Signed)
Amy Rowe, thank you for joining [name], PA-C for today's virtual visit.  While this provider is not your primary care provider (PCP), if your PCP is located in our provider database this encounter information will be shared with them immediately following your visit.   A [facility] MyChart account gives you access to today's visit and all your visits, tests, and labs performed at [facility] " click here if you don't have a [facility] MyChart account or go to [email or web address]  Consent: (Patient) Amy Rowe provided verbal consent for this virtual visit at the beginning of the encounter.  Current Medications:  Current Outpatient Medications:    acetaminophen (TYLENOL) 325 MG tablet, Take 2 tablets (650 mg total) by mouth every 6 (six) hours as needed for mild pain (or Fever >/= 101)., Disp: 12 tablet, Rfl: 0   albuterol (PROVENTIL) (2.5 MG/3ML) 0.083% nebulizer solution, Take 3 mLs (2.5 mg total) by nebulization every 4 (four) hours as needed for wheezing or shortness of breath., Disp: 75 mL, Rfl: 2   albuterol (VENTOLIN HFA) 108 (90 Base) MCG/ACT inhaler, Inhale 2 puffs into the lungs every 6 (six) hours as needed for wheezing or shortness of breath., Disp: 18 g, Rfl: 0   benztropine (COGENTIN) 0.5 MG tablet, Take 1 tablet (0.5 mg total) by mouth 2 (two) times daily as needed for tremors (dystonia). (Patient not taking: Reported on 08/04/2021), Disp: 10 tablet, Rfl: 0   citalopram (CELEXA) 20 MG tablet, Take 1 tablet (20 mg total) by mouth daily., Disp: 90 tablet, Rfl: 0   ePHEDrine-guaiFENesin (PRIMATENE ASTHMA PO), Take 1 tablet by mouth daily as needed. (Patient not taking: Reported on 12/21/2022), Disp: , Rfl:    fluticasone (FLONASE) 50 MCG/ACT nasal spray, Place 1 spray into both nostrils daily as needed for allergies or rhinitis., Disp: 1 mL, Rfl: 0   OLANZapine (ZYPREXA) 10 MG tablet, Take 1 tablet (10 mg total) by mouth at bedtime., Disp:  90 tablet, Rfl: 0   traZODone (DESYREL) 100 MG tablet, Take 1 tablet (100 mg total) by mouth at bedtime as needed for sleep (x1 repeat PRN)., Disp: 90 tablet, Rfl: 0   Medications ordered in this encounter:  Meds ordered this encounter  Medications   OLANZapine (ZYPREXA) 10 MG tablet    Sig: Take 1 tablet (10 mg total) by mouth at bedtime.    Dispense:  90 tablet    Refill:  0    Supervising Provider:   Merrilee Jansky [[id number]]   citalopram (CELEXA) 20 MG tablet    Sig: Take 1 tablet (20 mg total) by mouth daily.    Dispense:  90 tablet    Refill:  0    Supervising Provider:   Merrilee Jansky [[id number]]   traZODone (DESYREL) 100 MG tablet    Sig: Take 1 tablet (100 mg total) by mouth at bedtime as needed for sleep (x1 repeat PRN).    Dispense:  90 tablet    Refill:  0    Supervising Provider:   Merrilee Jansky [[id number]]     *If you need refills on other medications prior to your next appointment, please contact your pharmacy*  Follow-Up: Call back or seek an in-person evaluation if the symptoms worsen or if the condition fails to improve as anticipated.  [facility] [phone]  Other Instructions Keep scheduled appointment on 10/04/23   If you have been instructed to have an in-person evaluation today at a local  Urgent Care facility, please use the link below. It will take you to a list of all of our available [facility], including address, phone number and hours of operation. Please do not delay care.  [facility]  If you or a family member do not have a primary care provider, use the link below to schedule a visit and establish care. When you choose a [facility] primary care physician or advanced practice provider, you gain a long-term partner in health. Find a Primary Care Provider  Learn more about [facility] in-office and virtual care options:  - Get Care Now

## 2023-09-22 ENCOUNTER — Ambulatory Visit (INDEPENDENT_AMBULATORY_CARE_PROVIDER_SITE_OTHER): Payer: 59

## 2023-09-22 ENCOUNTER — Encounter (HOSPITAL_COMMUNITY): Payer: Self-pay

## 2023-09-22 ENCOUNTER — Ambulatory Visit
Admission: EM | Admit: 2023-09-22 | Discharge: 2023-09-22 | Disposition: A | Discharge status: Other Acute Inpatient Hospital | Payer: 59 | Attending: Family Medicine | Admitting: Family Medicine

## 2023-09-22 ENCOUNTER — Other Ambulatory Visit: Payer: Self-pay

## 2023-09-22 ENCOUNTER — Encounter: Payer: Self-pay | Admitting: *Deleted

## 2023-09-22 ENCOUNTER — Inpatient Hospital Stay (HOSPITAL_COMMUNITY)
Admission: EM | Admit: 2023-09-22 | Discharge: 2023-09-25 | DRG: 871 | Disposition: A | Discharge status: Home / Self Care | Payer: 59 | Attending: Internal Medicine | Admitting: Internal Medicine

## 2023-09-22 DIAGNOSIS — E860 Dehydration: Secondary | ICD-10-CM | POA: Diagnosis not present

## 2023-09-22 DIAGNOSIS — R6520 Severe sepsis without septic shock: Secondary | ICD-10-CM | POA: Diagnosis not present

## 2023-09-22 DIAGNOSIS — Z91040 Latex allergy status: Secondary | ICD-10-CM

## 2023-09-22 DIAGNOSIS — I10 Essential (primary) hypertension: Secondary | ICD-10-CM | POA: Diagnosis not present

## 2023-09-22 DIAGNOSIS — R918 Other nonspecific abnormal finding of lung field: Secondary | ICD-10-CM | POA: Diagnosis not present

## 2023-09-22 DIAGNOSIS — Z1152 Encounter for screening for COVID-19: Secondary | ICD-10-CM | POA: Diagnosis not present

## 2023-09-22 DIAGNOSIS — E871 Hypo-osmolality and hyponatremia: Secondary | ICD-10-CM | POA: Diagnosis not present

## 2023-09-22 DIAGNOSIS — J45901 Unspecified asthma with (acute) exacerbation: Secondary | ICD-10-CM | POA: Diagnosis present

## 2023-09-22 DIAGNOSIS — A419 Sepsis, unspecified organism: Secondary | ICD-10-CM | POA: Insufficient documentation

## 2023-09-22 DIAGNOSIS — Z6841 Body Mass Index (BMI) 40.0 and over, adult: Secondary | ICD-10-CM | POA: Diagnosis not present

## 2023-09-22 DIAGNOSIS — N179 Acute kidney failure, unspecified: Secondary | ICD-10-CM | POA: Diagnosis present

## 2023-09-22 DIAGNOSIS — J9601 Acute respiratory failure with hypoxia: Secondary | ICD-10-CM | POA: Diagnosis not present

## 2023-09-22 DIAGNOSIS — J189 Pneumonia, unspecified organism: Secondary | ICD-10-CM

## 2023-09-22 DIAGNOSIS — J441 Chronic obstructive pulmonary disease with (acute) exacerbation: Secondary | ICD-10-CM | POA: Diagnosis present

## 2023-09-22 DIAGNOSIS — F411 Generalized anxiety disorder: Secondary | ICD-10-CM | POA: Insufficient documentation

## 2023-09-22 DIAGNOSIS — J1000 Influenza due to other identified influenza virus with unspecified type of pneumonia: Secondary | ICD-10-CM | POA: Diagnosis not present

## 2023-09-22 DIAGNOSIS — R197 Diarrhea, unspecified: Secondary | ICD-10-CM | POA: Diagnosis not present

## 2023-09-22 DIAGNOSIS — R0602 Shortness of breath: Secondary | ICD-10-CM

## 2023-09-22 DIAGNOSIS — Z79899 Other long term (current) drug therapy: Secondary | ICD-10-CM

## 2023-09-22 DIAGNOSIS — J9811 Atelectasis: Secondary | ICD-10-CM | POA: Diagnosis not present

## 2023-09-22 DIAGNOSIS — F32A Depression, unspecified: Secondary | ICD-10-CM | POA: Diagnosis present

## 2023-09-22 DIAGNOSIS — J13 Pneumonia due to Streptococcus pneumoniae: Secondary | ICD-10-CM | POA: Diagnosis present

## 2023-09-22 DIAGNOSIS — E876 Hypokalemia: Secondary | ICD-10-CM | POA: Diagnosis present

## 2023-09-22 DIAGNOSIS — Z881 Allergy status to other antibiotic agents status: Secondary | ICD-10-CM | POA: Diagnosis not present

## 2023-09-22 DIAGNOSIS — J4521 Mild intermittent asthma with (acute) exacerbation: Secondary | ICD-10-CM

## 2023-09-22 DIAGNOSIS — Z87891 Personal history of nicotine dependence: Secondary | ICD-10-CM | POA: Diagnosis not present

## 2023-09-22 DIAGNOSIS — A403 Sepsis due to Streptococcus pneumoniae: Secondary | ICD-10-CM | POA: Diagnosis not present

## 2023-09-22 DIAGNOSIS — R509 Fever, unspecified: Secondary | ICD-10-CM | POA: Diagnosis not present

## 2023-09-22 DIAGNOSIS — E872 Acidosis, unspecified: Secondary | ICD-10-CM | POA: Insufficient documentation

## 2023-09-22 DIAGNOSIS — E66813 Obesity, class 3: Secondary | ICD-10-CM | POA: Diagnosis present

## 2023-09-22 DIAGNOSIS — J09X1 Influenza due to identified novel influenza A virus with pneumonia: Secondary | ICD-10-CM | POA: Diagnosis present

## 2023-09-22 DIAGNOSIS — A4189 Other specified sepsis: Principal | ICD-10-CM | POA: Diagnosis present

## 2023-09-22 DIAGNOSIS — R531 Weakness: Secondary | ICD-10-CM | POA: Diagnosis not present

## 2023-09-22 DIAGNOSIS — J1008 Influenza due to other identified influenza virus with other specified pneumonia: Secondary | ICD-10-CM | POA: Diagnosis not present

## 2023-09-22 DIAGNOSIS — R0689 Other abnormalities of breathing: Secondary | ICD-10-CM | POA: Diagnosis not present

## 2023-09-22 LAB — BASIC METABOLIC PANEL
Anion gap: 10 (ref 5–15)
BUN: 26 mg/dL — ABNORMAL HIGH (ref 6–20)
CO2: 21 mmol/L — ABNORMAL LOW (ref 22–32)
Calcium: 7.6 mg/dL — ABNORMAL LOW (ref 8.9–10.3)
Chloride: 103 mmol/L (ref 98–111)
Creatinine, Ser: 1.18 mg/dL — ABNORMAL HIGH (ref 0.44–1.00)
GFR, Estimated: 57 mL/min — ABNORMAL LOW (ref >60)
Glucose, Bld: 150 mg/dL — ABNORMAL HIGH (ref 70–99)
Potassium: 3 mmol/L — ABNORMAL LOW (ref 3.5–5.1)
Sodium: 134 mmol/L — ABNORMAL LOW (ref 135–145)

## 2023-09-22 LAB — CBC WITH DIFFERENTIAL/PLATELET
Abs Immature Granulocytes: 0.06 10*3/uL (ref 0.00–0.07)
Basophils Absolute: 0 10*3/uL (ref 0.0–0.1)
Basophils Relative: 1 %
Eosinophils Absolute: 0 10*3/uL (ref 0.0–0.5)
Eosinophils Relative: 0 %
HCT: 37.4 % (ref 36.0–46.0)
Hemoglobin: 12.1 g/dL (ref 12.0–15.0)
Immature Granulocytes: 1 %
Lymphocytes Relative: 15 %
Lymphs Abs: 0.9 10*3/uL (ref 0.7–4.0)
MCH: 27.1 pg (ref 26.0–34.0)
MCHC: 32.4 g/dL (ref 30.0–36.0)
MCV: 83.9 fL (ref 80.0–100.0)
Monocytes Absolute: 0.3 10*3/uL (ref 0.1–1.0)
Monocytes Relative: 4 %
Neutro Abs: 4.7 10*3/uL (ref 1.7–7.7)
Neutrophils Relative %: 79 %
Platelets: 186 10*3/uL (ref 150–400)
RBC: 4.46 MIL/uL (ref 3.87–5.11)
RDW: 14.5 % (ref 11.5–15.5)
WBC: 6 10*3/uL (ref 4.0–10.5)
nRBC: 0 % (ref 0.0–0.2)

## 2023-09-22 LAB — RESP PANEL BY RT-PCR (RSV, FLU A&B, COVID)  RVPGX2
Influenza A by PCR: POSITIVE — AB
Influenza B by PCR: NEGATIVE
Resp Syncytial Virus by PCR: NEGATIVE
SARS Coronavirus 2 by RT PCR: NEGATIVE

## 2023-09-22 LAB — COMPREHENSIVE METABOLIC PANEL
ALT: 42 U/L (ref 0–44)
AST: 57 U/L — ABNORMAL HIGH (ref 15–41)
Albumin: 2.7 g/dL — ABNORMAL LOW (ref 3.5–5.0)
Alkaline Phosphatase: 53 U/L (ref 38–126)
Anion gap: 16 — ABNORMAL HIGH (ref 5–15)
BUN: 27 mg/dL — ABNORMAL HIGH (ref 6–20)
CO2: 16 mmol/L — ABNORMAL LOW (ref 22–32)
Calcium: 7.7 mg/dL — ABNORMAL LOW (ref 8.9–10.3)
Chloride: 99 mmol/L (ref 98–111)
Creatinine, Ser: 1.59 mg/dL — ABNORMAL HIGH (ref 0.44–1.00)
GFR, Estimated: 40 mL/min — ABNORMAL LOW (ref >60)
Glucose, Bld: 178 mg/dL — ABNORMAL HIGH (ref 70–99)
Potassium: 2.8 mmol/L — ABNORMAL LOW (ref 3.5–5.1)
Sodium: 131 mmol/L — ABNORMAL LOW (ref 135–145)
Total Bilirubin: 1.1 mg/dL (ref 0.0–1.2)
Total Protein: 6.5 g/dL (ref 6.5–8.1)

## 2023-09-22 LAB — POC COVID19/FLU A&B COMBO
Covid Antigen, POC: NEGATIVE
Influenza A Antigen, POC: NEGATIVE
Influenza B Antigen, POC: NEGATIVE

## 2023-09-22 LAB — I-STAT CG4 LACTIC ACID, ED
Lactic Acid, Venous: 3.6 mmol/L (ref 0.5–1.9)
Lactic Acid, Venous: 3.2 mmol/L (ref 0.5–1.9)

## 2023-09-22 LAB — HCG, SERUM, QUALITATIVE: Preg, Serum: NEGATIVE

## 2023-09-22 LAB — APTT: aPTT: 29 s (ref 24–36)

## 2023-09-22 LAB — PROTIME-INR
INR: 1.2 (ref 0.8–1.2)
Prothrombin Time: 14.9 s (ref 11.4–15.2)

## 2023-09-22 MED ORDER — SODIUM CHLORIDE 0.9 % IV SOLN
100.0000 mg | Freq: Two times a day (BID) | INTRAVENOUS | Status: DC
  Administered 2023-09-23 – 2023-09-24 (×3): 100 mg via INTRAVENOUS
  Filled 2023-09-22 (×3): qty 100

## 2023-09-22 MED ORDER — IPRATROPIUM-ALBUTEROL 0.5-2.5 (3) MG/3ML IN SOLN
3.0000 mL | Freq: Four times a day (QID) | RESPIRATORY_TRACT | Status: DC
  Administered 2023-09-23 – 2023-09-25 (×8): 3 mL via RESPIRATORY_TRACT
  Filled 2023-09-22 (×9): qty 3

## 2023-09-22 MED ORDER — SODIUM CHLORIDE 0.9 % IV BOLUS
1000.0000 mL | Freq: Once | INTRAVENOUS | Status: AC
  Administered 2023-09-22: 1000 mL via INTRAVENOUS

## 2023-09-22 MED ORDER — LACTATED RINGERS IV BOLUS (SEPSIS)
1000.0000 mL | Freq: Once | INTRAVENOUS | Status: AC
Start: 2023-09-22 — End: 2023-09-22
  Administered 2023-09-22: 1000 mL via INTRAVENOUS

## 2023-09-22 MED ORDER — ONDANSETRON HCL 4 MG PO TABS: 4.0000 mg | ORAL_TABLET | Freq: Four times a day (QID) | ORAL | Status: DC | PRN

## 2023-09-22 MED ORDER — METHYLPREDNISOLONE ACETATE 80 MG/ML IJ SUSP
80.0000 mg | Freq: Once | INTRAMUSCULAR | Status: AC
  Administered 2023-09-22: 80 mg via INTRAMUSCULAR

## 2023-09-22 MED ORDER — SODIUM CHLORIDE 0.9% FLUSH
3.0000 mL | Freq: Two times a day (BID) | INTRAVENOUS | Status: DC
  Administered 2023-09-23 – 2023-09-24 (×3): 3 mL via INTRAVENOUS

## 2023-09-22 MED ORDER — ACETAMINOPHEN 325 MG PO TABS: 650.0000 mg | ORAL_TABLET | Freq: Four times a day (QID) | ORAL | Status: DC | PRN

## 2023-09-22 MED ORDER — SODIUM CHLORIDE 0.9 % IV SOLN: 250.0000 mL | INTRAVENOUS | Status: AC | PRN

## 2023-09-22 MED ORDER — LACTATED RINGERS IV SOLN: INTRAVENOUS | Status: AC

## 2023-09-22 MED ORDER — LACTATED RINGERS IV BOLUS (SEPSIS)
1000.0000 mL | Freq: Once | INTRAVENOUS | Status: AC
  Administered 2023-09-22: 1000 mL via INTRAVENOUS

## 2023-09-22 MED ORDER — CITALOPRAM HYDROBROMIDE 20 MG PO TABS
20.0000 mg | ORAL_TABLET | Freq: Every day | ORAL | Status: DC
  Administered 2023-09-23 – 2023-09-25 (×3): 20 mg via ORAL
  Filled 2023-09-22: qty 2
  Filled 2023-09-22 (×2): qty 1

## 2023-09-22 MED ORDER — IBUPROFEN 800 MG PO TABS
800.0000 mg | ORAL_TABLET | Freq: Once | ORAL | Status: AC
  Administered 2023-09-22: 800 mg via ORAL

## 2023-09-22 MED ORDER — ACETAMINOPHEN 650 MG RE SUPP: 650.0000 mg | Freq: Four times a day (QID) | RECTAL | Status: DC | PRN

## 2023-09-22 MED ORDER — METHYLPREDNISOLONE SODIUM SUCC 125 MG IJ SOLR: 125.0000 mg | Freq: Once | INTRAMUSCULAR | Status: DC

## 2023-09-22 MED ORDER — SODIUM CHLORIDE 0.9 % IV SOLN
500.0000 mg | Freq: Once | INTRAVENOUS | Status: AC
  Administered 2023-09-22: 500 mg via INTRAVENOUS
  Filled 2023-09-22: qty 5

## 2023-09-22 MED ORDER — METHYLPREDNISOLONE SODIUM SUCC 40 MG IJ SOLR
40.0000 mg | INTRAMUSCULAR | Status: DC
  Administered 2023-09-23 – 2023-09-25 (×3): 40 mg via INTRAVENOUS
  Filled 2023-09-22 (×3): qty 1

## 2023-09-22 MED ORDER — OLANZAPINE 10 MG PO TABS
10.0000 mg | ORAL_TABLET | Freq: Every day | ORAL | Status: DC
  Administered 2023-09-22 – 2023-09-24 (×3): 10 mg via ORAL
  Filled 2023-09-22 (×3): qty 1

## 2023-09-22 MED ORDER — OSELTAMIVIR PHOSPHATE 30 MG PO CAPS: 30.0000 mg | ORAL_CAPSULE | Freq: Two times a day (BID) | ORAL | Status: DC

## 2023-09-22 MED ORDER — CEFTRIAXONE SODIUM 1 G IJ SOLR
1.0000 g | Freq: Once | INTRAMUSCULAR | Status: AC
  Administered 2023-09-22: 1 g via INTRAMUSCULAR

## 2023-09-22 MED ORDER — OSELTAMIVIR PHOSPHATE 75 MG PO CAPS
75.0000 mg | ORAL_CAPSULE | Freq: Once | ORAL | Status: AC
  Administered 2023-09-22: 75 mg via ORAL
  Filled 2023-09-22: qty 1

## 2023-09-22 MED ORDER — LEVALBUTEROL HCL 0.63 MG/3ML IN NEBU
0.6300 mg | INHALATION_SOLUTION | Freq: Four times a day (QID) | RESPIRATORY_TRACT | Status: DC
  Administered 2023-09-22: 0.63 mg via RESPIRATORY_TRACT
  Filled 2023-09-22: qty 3

## 2023-09-22 MED ORDER — ACETAMINOPHEN 325 MG PO TABS
650.0000 mg | ORAL_TABLET | Freq: Once | ORAL | Status: AC
  Administered 2023-09-22: 650 mg via ORAL
  Filled 2023-09-22: qty 2

## 2023-09-22 MED ORDER — SODIUM CHLORIDE 0.9 % IV SOLN
2.0000 g | INTRAVENOUS | Status: DC
  Administered 2023-09-23 – 2023-09-24 (×2): 2 g via INTRAVENOUS
  Filled 2023-09-22 (×2): qty 20

## 2023-09-22 MED ORDER — SENNOSIDES-DOCUSATE SODIUM 8.6-50 MG PO TABS: 1.0000 | ORAL_TABLET | Freq: Every evening | ORAL | Status: DC | PRN

## 2023-09-22 MED ORDER — ENOXAPARIN SODIUM 60 MG/0.6ML IJ SOSY
50.0000 mg | PREFILLED_SYRINGE | INTRAMUSCULAR | Status: DC
  Administered 2023-09-23 – 2023-09-24 (×2): 50 mg via SUBCUTANEOUS
  Filled 2023-09-22 (×3): qty 0.6

## 2023-09-22 MED ORDER — METHYLPREDNISOLONE 4 MG PO TBPK: ORAL_TABLET | ORAL | 0 refills | Status: DC

## 2023-09-22 MED ORDER — PANTOPRAZOLE SODIUM 40 MG PO TBEC
40.0000 mg | DELAYED_RELEASE_TABLET | Freq: Every day | ORAL | Status: DC
  Administered 2023-09-22 – 2023-09-25 (×4): 40 mg via ORAL
  Filled 2023-09-22 (×4): qty 1

## 2023-09-22 MED ORDER — SODIUM CHLORIDE 0.9 % IV SOLN: 2.0000 g | Freq: Once | INTRAVENOUS | Status: DC

## 2023-09-22 MED ORDER — IPRATROPIUM-ALBUTEROL 0.5-2.5 (3) MG/3ML IN SOLN
3.0000 mL | Freq: Once | RESPIRATORY_TRACT | Status: AC
  Administered 2023-09-22: 3 mL via RESPIRATORY_TRACT

## 2023-09-22 MED ORDER — POTASSIUM CHLORIDE CRYS ER 20 MEQ PO TBCR
40.0000 meq | EXTENDED_RELEASE_TABLET | Freq: Once | ORAL | Status: AC
  Administered 2023-09-23: 40 meq via ORAL
  Filled 2023-09-22: qty 2

## 2023-09-22 MED ORDER — POTASSIUM CHLORIDE 10 MEQ/100ML IV SOLN
10.0000 meq | INTRAVENOUS | Status: AC
  Administered 2023-09-22 (×3): 10 meq via INTRAVENOUS
  Filled 2023-09-22 (×3): qty 100

## 2023-09-22 MED ORDER — HYDROCODONE BIT-HOMATROP MBR 5-1.5 MG/5ML PO SOLN: 5.0000 mL | Freq: Four times a day (QID) | ORAL | 0 refills | Status: DC | PRN

## 2023-09-22 MED ORDER — ALBUTEROL SULFATE (2.5 MG/3ML) 0.083% IN NEBU: 2.5000 mg | INHALATION_SOLUTION | RESPIRATORY_TRACT | Status: DC | PRN

## 2023-09-22 MED ORDER — ONDANSETRON HCL 4 MG/2ML IJ SOLN
4.0000 mg | Freq: Four times a day (QID) | INTRAMUSCULAR | Status: DC | PRN
Start: 2023-09-22 — End: 2023-09-25

## 2023-09-22 MED ORDER — SODIUM CHLORIDE 0.9 % IV SOLN
1.0000 g | Freq: Once | INTRAVENOUS | Status: AC
  Administered 2023-09-22: 1 g via INTRAVENOUS
  Filled 2023-09-22: qty 10

## 2023-09-22 MED ORDER — SODIUM CHLORIDE 0.9% FLUSH: 3.0000 mL | INTRAVENOUS | Status: DC | PRN

## 2023-09-22 MED ORDER — LEVOFLOXACIN 750 MG PO TABS: 750.0000 mg | ORAL_TABLET | Freq: Every day | ORAL | 0 refills | Status: DC

## 2023-09-22 NOTE — ED Notes (Signed)
Pt. CG4 I-stat results 3.18, EDP, Allen made aware.

## 2023-09-22 NOTE — ED Notes (Signed)
MD at bedside to re-evaluate pt

## 2023-09-22 NOTE — ED Triage Notes (Addendum)
Pt reports "trouble breathing"x 2-3  weeks. States getting worse. Has been using nebs and inhalers- not helping. Also states "I have the runs" x 1 week

## 2023-09-22 NOTE — Sepsis Progress Note (Signed)
eLink is following this Code Sepsis. °

## 2023-09-22 NOTE — ED Notes (Signed)
[facility] called for transport.

## 2023-09-22 NOTE — ED Provider Notes (Signed)
[place] EMERGENCY DEPARTMENT AT [facility] Provider Note   CSN: [id number] Arrival date & time: 09/22/23  1812     History  No chief complaint on file.   Amy Rowe is a 48 y.o. female.  48 year old for female with history of asthma presents emergent care after being diagnosed with right sided pneumonia.  States that she is at call for several weeks but worse over the last several days.  Notes that she has had increasing dyspnea on exertion.  Records from urgent care reviewed.  X-ray reviewed from there which shows pneumonia.  She was given albuterol, methylprednisone, Rocephin, ibuprofen and saline.  Has an oxygen requirement of 4 L.  Has been hospitalized in the past for pneumonia.  Denies any tobacco use.  No illicit drug use.  She was transported here for further management       Home Medications Prior to Admission medications   Medication Sig Start Date End Date Taking? Authorizing Provider  acetaminophen (TYLENOL) 325 MG tablet Take 2 tablets (650 mg total) by mouth every 6 (six) hours as needed for mild pain (or Fever >/= 101). 08/05/21   Shon Hale, MD  albuterol (PROVENTIL) (2.5 MG/3ML) 0.083% nebulizer solution Take 3 mLs (2.5 mg total) by nebulization every 4 (four) hours as needed for wheezing or shortness of breath. 08/05/21 09/22/23  Shon Hale, MD  albuterol (VENTOLIN HFA) 108 (90 Base) MCG/ACT inhaler Inhale 2 puffs into the lungs every 6 (six) hours as needed for wheezing or shortness of breath. 08/05/21   Shon Hale, MD  benztropine (COGENTIN) 0.5 MG tablet Take 1 tablet (0.5 mg total) by mouth 2 (two) times daily as needed for tremors (dystonia). Patient not taking: Reported on 08/04/2021 10/13/20   Laveda Abbe, NP  citalopram (CELEXA) 20 MG tablet Take 1 tablet (20 mg total) by mouth daily. 08/22/23   [name], PA-C  ePHEDrine-guaiFENesin (PRIMATENE ASTHMA PO) Take 1 tablet by mouth daily as needed. Patient not  taking: Reported on 12/21/2022    [provider]  fluticasone (FLONASE) 50 MCG/ACT nasal spray Place 1 spray into both nostrils daily as needed for allergies or rhinitis. 10/13/20   Laveda Abbe, NP  HYDROcodone bit-homatropine (HYCODAN) 5-1.5 MG/5ML syrup Take 5 mLs by mouth every 6 (six) hours as needed for cough. 09/22/23   Mardella Layman, MD  levofloxacin (LEVAQUIN) 750 MG tablet Take 1 tablet (750 mg total) by mouth daily. 09/22/23   Mardella Layman, MD  methylPREDNISolone (MEDROL DOSEPAK) 4 MG TBPK tablet Take as directed. 09/22/23   Mardella Layman, MD  OLANZapine (ZYPREXA) 10 MG tablet Take 1 tablet (10 mg total) by mouth at bedtime. 08/22/23   [name], PA-C  traZODone (DESYREL) 100 MG tablet Take 1 tablet (100 mg total) by mouth at bedtime as needed for sleep (x1 repeat PRN). 08/22/23   [name], PA-C      Allergies    Other and Latex    Review of Systems   Review of Systems  All other systems reviewed and are negative.   Physical Exam Updated Vital Signs BP 104/65 (BP Location: Left Arm)   Pulse (!) 105   Temp 98.4 F (36.9 C) (Oral)   Resp (!) 29   LMP  (LMP Unknown)   SpO2 95%  Physical Exam Vitals and nursing note reviewed.  Constitutional:      General: She is not in acute distress.    Appearance: Normal appearance. She is well-developed.  She is not toxic-appearing.  HENT:     Head: Normocephalic and atraumatic.  Eyes:     General: Lids are normal.     Conjunctiva/sclera: Conjunctivae normal.     Pupils: Pupils are equal, round, and reactive to light.  Neck:     Thyroid: No thyroid mass.     Trachea: No tracheal deviation.  Cardiovascular:     Rate and Rhythm: Regular rhythm. Tachycardia present.     Heart sounds: Normal heart sounds. No murmur heard.    No gallop.  Pulmonary:     Effort: Tachypnea and respiratory distress present.     Breath sounds: No stridor. Examination of the right-upper field reveals decreased breath  sounds. Decreased breath sounds present. No wheezing, rhonchi or rales.  Abdominal:     General: There is no distension.     Palpations: Abdomen is soft.     Tenderness: There is no abdominal tenderness. There is no rebound.  Musculoskeletal:        General: No tenderness. Normal range of motion.     Cervical back: Normal range of motion and neck supple.  Skin:    General: Skin is warm and dry.     Findings: No abrasion or rash.  Neurological:     Mental Status: She is alert and oriented to person, place, and time. Mental status is at baseline.     GCS: GCS eye subscore is 4. GCS verbal subscore is 5. GCS motor subscore is 6.     Cranial Nerves: No cranial nerve deficit.     Sensory: No sensory deficit.     Motor: Motor function is intact.  Psychiatric:        Attention and Perception: Attention normal.        Speech: Speech normal.        Behavior: Behavior normal.     ED Results / Procedures / Treatments   Labs (all labs ordered are listed, but only abnormal results are displayed) Labs Reviewed  RESP PANEL BY RT-PCR (RSV, FLU A&B, COVID)  [id number]  CULTURE, BLOOD (ROUTINE X 2)  CULTURE, BLOOD (ROUTINE X 2)  COMPREHENSIVE METABOLIC PANEL  CBC WITH DIFFERENTIAL/PLATELET  PROTIME-INR  APTT  HCG, SERUM, QUALITATIVE  URINALYSIS, W/ REFLEX TO CULTURE (INFECTION SUSPECTED)  I-STAT CG4 LACTIC ACID, ED    EKG EKG Interpretation Date/Time:  Thursday September 22 2023 18:32:01 EST Ventricular Rate:  102 PR Interval:  119 QRS Duration:  98 QT Interval:  382 QTC Calculation: 498 R Axis:   46  Text Interpretation: Sinus tachycardia Atrial premature complexes Consider right atrial enlargement Borderline prolonged QT interval Confirmed by Lorre Nick ([id number]) on 09/22/2023 6:42:37 PM  Radiology DG Chest 2 View Result Date: 09/22/2023 CLINICAL DATA:  Shortness of breath. EXAM: CHEST - 2 VIEW COMPARISON:  August 04, 2021. FINDINGS: The heart size and mediastinal contours are  within normal limits. Large right upper lobe opacity is noted concerning for pneumonia. Mild bibasilar atelectasis or infiltrates are noted as well. The visualized skeletal structures are unremarkable. IMPRESSION: Large right upper lobe opacity is noted concerning for pneumonia. Bibasilar atelectasis or infiltrates are noted as well. Followup PA and lateral chest X-ray is recommended in 3-4 weeks following trial of antibiotic therapy to ensure resolution and exclude underlying malignancy. Electronically Signed   By: Lupita Raider M.D.   On: 09/22/2023 14:31    Procedures Procedures    Medications Ordered in ED Medications  lactated ringers infusion (has no administration in time  range)  lactated ringers bolus 1,000 mL (has no administration in time range)    And  lactated ringers bolus 1,000 mL (has no administration in time range)    And  lactated ringers bolus 1,000 mL (has no administration in time range)  cefTRIAXone (ROCEPHIN) 2 g in sodium chloride 0.9 % 100 mL IVPB (has no administration in time range)  azithromycin (ZITHROMAX) 500 mg in sodium chloride 0.9 % 250 mL IVPB (has no administration in time range)    ED Course/ Medical Decision Making/ A&P                                 Medical Decision Making Amount and/or Complexity of Data Reviewed Labs: ordered.  Risk OTC drugs. Prescription drug management.   Patient's chest x-ray reviewed from outside source and shows significant pneumonia on the right side.  Code sepsis started when patient arrived.  Started on IV antibiotics as well as IV fluids.  Mildly hypokalemic and potassium runs ordered.  She is also on oxygen at 4 L.  Reassessed multiple times and is still oxygenating well.  She is influenza A positive.  Has evidence of acute kidney injury which will be treated with IV fluids as above.  Elevated lactate as well 2.  Plan will be for hospitalist admission to stepdown unit  CRITICAL CARE Performed by: Toy Baker Total critical care time: 70 minutes Critical care time was exclusive of separately billable procedures and treating other patients. Critical care was necessary to treat or prevent imminent or life-threatening deterioration. Critical care was time spent personally by me on the following activities: development of treatment plan with patient and/or surrogate as well as nursing, discussions with consultants, evaluation of patient's response to treatment, examination of patient, obtaining history from patient or surrogate, ordering and performing treatments and interventions, ordering and review of laboratory studies, ordering and review of radiographic studies, pulse oximetry and re-evaluation of patient's condition.         Final Clinical Impression(s) / ED Diagnoses Final diagnoses:  None    Rx / DC Orders ED Discharge Orders     None         Lorre Nick, MD 09/22/23 2111

## 2023-09-22 NOTE — ED Notes (Signed)
Dr. Tracie Harrier assessing patient

## 2023-09-22 NOTE — H&P (Signed)
History and Physical    Amy Rowe WNU:[id number] DOB: 22-Feb-1976 DOA: 09/22/2023  PCP: Pcp, No   Patient coming from: Home   Chief Complaint:  Chief Complaint  Patient presents with   Code Sepsis   ED TRIAGE note:  Pt BIBA from UC, c/o SOB, increased lethargy, fever and diarrhea. Started 2 weeks ago. Had fever 101.7 at [facility] and was tachy there too. Pneumonia confirmed on XR at [facility].  UC gave Duoneb, methylprednisolone, rocephin, ibuprofen, and NS bolus.       HPI:  Amy Rowe is a 48 y.o. female with medical history of asthma, generalized anxiety disorder, chronic smoker, depression, schizophrenia presented to emergency department with complaining of diagnosis of pneumonia in the urgent care.  Patient reported that she she has been not feeling well for several days.  Noticed increased dyspnea on exertion.  Patient went to the urgent care and have an imaging which showed pneumonia.  Patient was treated with albuterol, methylprednisone Rocephin ibuprofen and saline.  She also having new requirement of oxygen for liter.   ED Course:  At presentation to ED patient O2 sat 95% 97% for liter oxygen tachycardic and borderline hypotensive. Respiratory panel positive with influenza A. CMP showing low sodium 131, low potassium 2.8, low bicarb 16 elevated creatinine 1.16 low calcium 7.7, and elevated anion gap 16. CBC unremarkable. Blood cultures are in process. Lactic acid 3.6. Normal pro time and INR. Pregnancy test negative. EKG shows sinus tachycardia heart rate 102. Chest x-ray showed large right upper lobe opacity concerning for pneumonia.  Bibasilar atelectasis.Followup PA and lateral chest X-ray is recommended in 3-4 weeks following trial of antibiotic therapy to ensure resolution and exclude underlying malignancy.  In the ED code sepsis has been initiated based on elevated lactic acid.  Patient received a liter of LR bolus, currently on LR 150 cc/h, received azithromycin  and ceftriaxone.  Hospitalist has been contacted for further evaluation management of pneumonia and asthma exacerbation.    Significant labs in the ED: Lab Orders         Resp panel by RT-PCR (RSV, Flu A&B, Covid) Anterior Nasal Swab         Blood Culture (routine x 2)         Comprehensive metabolic panel         CBC with Differential         Urinalysis, w/ Reflex to Culture (Infection Suspected) -Urine, Clean Catch         Protime-INR         APTT         hCG, serum, qualitative         I-Stat Lactic Acid, ED       Review of Systems:  ROS  Past Medical History:  Diagnosis Date   Anxiety    Asthma    Bronchitis    Depression     Past Surgical History:  Procedure Laterality Date   CESAREAN SECTION       reports that she has quit smoking. Her smoking use included cigarettes. She has a 2.5 pack-year smoking history. She has never used smokeless tobacco. She reports that she does not currently use alcohol. She reports that she does not use drugs.  Allergies  Allergen Reactions   Other     ANTIBIOTIC-Name is unknown. States that reaction occurred along time ago that included having an infection after   Latex Swelling and Rash    History reviewed. No pertinent family history.  Prior to Admission medications   Medication Sig Start Date End Date Taking? Authorizing Provider  acetaminophen (TYLENOL) 325 MG tablet Take 2 tablets (650 mg total) by mouth every 6 (six) hours as needed for mild pain (or Fever >/= 101). 08/05/21   Shon Hale, MD  albuterol (PROVENTIL) (2.5 MG/3ML) 0.083% nebulizer solution Take 3 mLs (2.5 mg total) by nebulization every 4 (four) hours as needed for wheezing or shortness of breath. 08/05/21 09/22/23  Shon Hale, MD  albuterol (VENTOLIN HFA) 108 (90 Base) MCG/ACT inhaler Inhale 2 puffs into the lungs every 6 (six) hours as needed for wheezing or shortness of breath. 08/05/21   Shon Hale, MD  benztropine (COGENTIN) 0.5 MG tablet  Take 1 tablet (0.5 mg total) by mouth 2 (two) times daily as needed for tremors (dystonia). Patient not taking: Reported on 08/04/2021 10/13/20   Laveda Abbe, NP  citalopram (CELEXA) 20 MG tablet Take 1 tablet (20 mg total) by mouth daily. 08/22/23   [name], PA-C  ePHEDrine-guaiFENesin (PRIMATENE ASTHMA PO) Take 1 tablet by mouth daily as needed. Patient not taking: Reported on 12/21/2022    [provider]  fluticasone (FLONASE) 50 MCG/ACT nasal spray Place 1 spray into both nostrils daily as needed for allergies or rhinitis. 10/13/20   Laveda Abbe, NP  HYDROcodone bit-homatropine (HYCODAN) 5-1.5 MG/5ML syrup Take 5 mLs by mouth every 6 (six) hours as needed for cough. 09/22/23   Mardella Layman, MD  levofloxacin (LEVAQUIN) 750 MG tablet Take 1 tablet (750 mg total) by mouth daily. 09/22/23   Mardella Layman, MD  methylPREDNISolone (MEDROL DOSEPAK) 4 MG TBPK tablet Take as directed. 09/22/23   Mardella Layman, MD  OLANZapine (ZYPREXA) 10 MG tablet Take 1 tablet (10 mg total) by mouth at bedtime. 08/22/23   [name], PA-C  traZODone (DESYREL) 100 MG tablet Take 1 tablet (100 mg total) by mouth at bedtime as needed for sleep (x1 repeat PRN). 08/22/23   [name], PA-C     Physical Exam: Vitals:   09/22/23 1831 09/22/23 1859 09/22/23 1930 09/22/23 2049  BP: 104/65  108/71 109/69  Pulse: (!) 105  97 98  Resp: (!) 29  (!) 33 (!) 24  Temp: 98.4 F (36.9 C)     TempSrc: Oral     SpO2: 95%  95% 97%  Weight:  106.6 kg    Height:  5\' 3"  (1.6 m)      Physical Exam   Labs on Admission: I have personally reviewed following labs and imaging studies  CBC: Recent Labs  Lab 09/22/23 1848  WBC 6.0  NEUTROABS 4.7  HGB 12.1  HCT 37.4  MCV 83.9  PLT 186   Basic Metabolic Panel: Recent Labs  Lab 09/22/23 1848  NA 131*  K 2.8*  CL 99  CO2 16*  GLUCOSE 178*  BUN 27*  CREATININE 1.59*  CALCIUM 7.7*   GFR: Estimated Creatinine  Clearance: 50.1 mL/min (A) (by C-G formula based on SCr of 1.59 mg/dL (H)). Liver Function Tests: Recent Labs  Lab 09/22/23 1848  AST 57*  ALT 42  ALKPHOS 53  BILITOT 1.1  PROT 6.5  ALBUMIN 2.7*   No results for input(s): "LIPASE", "AMYLASE" in the last 168 hours. No results for input(s): "AMMONIA" in the last 168 hours. Coagulation Profile: Recent Labs  Lab 09/22/23 2023  INR 1.2   Cardiac Enzymes: No results for input(s): "CKTOTAL", "CKMB", "CKMBINDEX", "TROPONINI", "TROPONINIHS" in the last 168 hours. BNP (last  3 results) No results for input(s): "BNP" in the last 8760 hours. HbA1C: No results for input(s): "HGBA1C" in the last 72 hours. CBG: No results for input(s): "GLUCAP" in the last 168 hours. Lipid Profile: No results for input(s): "CHOL", "HDL", "LDLCALC", "TRIG", "CHOLHDL", "LDLDIRECT" in the last 72 hours. Thyroid Function Tests: No results for input(s): "TSH", "T4TOTAL", "FREET4", "T3FREE", "THYROIDAB" in the last 72 hours. Anemia Panel: No results for input(s): "VITAMINB12", "FOLATE", "FERRITIN", "TIBC", "IRON", "RETICCTPCT" in the last 72 hours. Urine analysis:    Component Value Date/Time   COLORURINE YELLOW 08/13/2015 0610   APPEARANCEUR CLEAR 08/13/2015 0610   LABSPEC <1.005 (L) 08/13/2015 0610   PHURINE 6.0 08/13/2015 0610   GLUCOSEU NEGATIVE 08/13/2015 0610   HGBUR TRACE (A) 08/13/2015 0610   BILIRUBINUR NEGATIVE 08/13/2015 0610   KETONESUR NEGATIVE 08/13/2015 0610   PROTEINUR NEGATIVE 08/13/2015 0610   NITRITE NEGATIVE 08/13/2015 0610   LEUKOCYTESUR NEGATIVE 08/13/2015 0610    Radiological Exams on Admission: I have personally reviewed images DG Chest 2 View Result Date: 09/22/2023 CLINICAL DATA:  Shortness of breath. EXAM: CHEST - 2 VIEW COMPARISON:  August 04, 2021. FINDINGS: The heart size and mediastinal contours are within normal limits. Large right upper lobe opacity is noted concerning for pneumonia. Mild bibasilar atelectasis or  infiltrates are noted as well. The visualized skeletal structures are unremarkable. IMPRESSION: Large right upper lobe opacity is noted concerning for pneumonia. Bibasilar atelectasis or infiltrates are noted as well. Followup PA and lateral chest X-ray is recommended in 3-4 weeks following trial of antibiotic therapy to ensure resolution and exclude underlying malignancy. Electronically Signed   By: Lupita Raider M.D.   On: 09/22/2023 14:31     EKG: My personal interpretation of EKG shows: Sinus tachycardia heart rate 102 and atrial premature complex.    Assessment/Plan: Active Problems:   Asthma, chronic, unspecified asthma severity, with acute exacerbation   Sepsis (HCC)   CAP (community acquired pneumonia)   AKI (acute kidney injury) (HCC)   Influenza A with pneumonia   Hyponatremia   Metabolic acidosis   Hypokalemia    Assessment and Plan: Right upper lobe pneumonia Influenza A associated pneumonia Sepsis secondary to pneumonia > Presented to emergency department complaining of shortness of breath, increased lethargy, fever, diarrhea that started 2 weeks ago initially went to urgent care treated with DuoNeb, methylprednisolone Rocephin, ibuprofen and NS bolus for pneumonia. - Patient is tachycardic heart rate 101, borderline hypotensive, O2 sat 96% on 2 L, elevated lactic acid 3.6, chest x-ray showed evidence of pneumonia.  Patient meets sepsis criteria. -Chest x-ray showed large right upper lobe opacity concerning for pneumonia, bibasilar atelectasis, infiltrate.  Recommended follow-up PA and lateral chest x-ray in 3 to 4 weeks following trial of antibiotic therapy for resolution and exclude underlying malignancy. -In the ED patient has been treated with ceftriaxone 1 g and azithromycin 500 mg. -Respiratory panel positive with influenza A. -Pending blood culture, sputum culture, urine Legionella and urine strep antigen. - Plan to continue IV ceftriaxone 2 g daily and doxycycline  100 mg twice daily to complete 7 days course.   -Continue Tamiflu 30 mg twice daily for 7 days course.  (Tamiflu dose has been adjusted by pharmacy based on renal function) - Patient received LR 3 L in the ED.  Continue LR 150 cc/h for 24 hours. -Continue supportive care. -Given patient has AKI holding CT chest with contrast for now.  Patient needs repeat chest x-ray in 4 weeks for the follow-up. -  Continue to trend lactic acid, WBC and monitor fever curve.   Asthma exacerbation-secondary to pneumonia - In the urgent care patient has been treated with Solu-Medrol 125 mg. -Continue IV Solu-Medrol 40 mg daily -Continue Xopenex every 6 hours scheduled and albuterol as needed. - Continue supplemental oxygen and will wean down as patient tolerates. -Continue supportive care.   Prerenal AKI -Elevated creatinine 1.59.  Baseline normal renal function.  Prerenal acute kidney injury in the setting of sepsis. -Checking UA, urine creatinine and sodium. - Continue IV fluid resuscitation, continue to monitor renal function, avoid nephrotoxic agent and renally adjust medications   Hyponatremia -Low sodium 131.  Hyponatremia the setting of AKI and poor oral solute intake. - Currently treating patient with LR.  Continue to monitor serum sodium level.  Metabolic acidosis -Bicarb 16 and a.  Anion gap 16 as well.  Anion metabolic acidosis in the setting of lactic acidosis sepsis and diarrhea.  Currently managing with IV fluid.  Continue to monitor bicarb and anion gap level.  Diarrhea secondary to influenza A -Patient reported 2-3 episodes of loose stool in a day.  Given patient does not treated with any oral antibiotic outpatient no concern for C. difficile. - Checking GI pathogen panel. Continue IV hydration and encouraging oral intake.  Hypokalemia -Low potassium 2.8 repleted with oral KCl.  Generalized anxiety disorder -Continue Celexa 20 mg daily and Zyprexa 10 mg at bedtime.  Reported not  taking Cogentin anymore.  Chronic smoker -Patient reported currently not smoking cigarette anymore.  And she is declining nicotine patch.  DVT prophylaxis:  Lovenox Code Status:  Full Code Diet: Heart healthy diet. Family Communication: None present Disposition Plan: Pending pneumonia workup-blood culture, sputum cultures.  Pending improvement of dyspnea.  Tentative discharge to home next 2 to 3 days Consults: None at this time Admission status:   Inpatient, Telemetry bed  Severity of Illness: The appropriate patient status for this patient is INPATIENT. Inpatient status is judged to be reasonable and necessary in order to provide the required intensity of service to ensure the patient's safety. The patient's presenting symptoms, physical exam findings, and initial radiographic and laboratory data in the context of their chronic comorbidities is felt to place them at high risk for further clinical deterioration. Furthermore, it is not anticipated that the patient will be medically stable for discharge from the hospital within 2 midnights of admission.   * I certify that at the point of admission it is my clinical judgment that the patient will require inpatient hospital care spanning beyond 2 midnights from the point of admission due to high intensity of service, high risk for further deterioration and high frequency of surveillance required.Marland Kitchen    Tereasa [name], MD [facility]  How to contact the [facility] Attending or Consulting provider 7A - 7P or covering provider during after hours 7P -7A, for this patient.  Check the care team in [facility] and look for a) attending/consulting [facility] provider listed and b) the [facility] team listed Log into [email or web address] and use [place]'s universal password to access. If you do not have the password, please contact the hospital operator. Locate the [facility] provider you are looking for under [facility] and page to a number that you can be directly  reached. If you still have difficulty reaching the provider, please page the [facility] (Director on Call) for the Hospitalists listed on amion for assistance.  09/22/2023, 9:30 PM

## 2023-09-22 NOTE — ED Notes (Signed)
Patient is being discharged from the Urgent Care and sent to the Emergency Department via EMS . Per Dr. Tracie Harrier, patient is in need of higher level of care due to Dx of pneumonia requiring admission. Patient is aware and verbalizes understanding of plan of care.  Vitals:   09/22/23 1635 09/22/23 1653  BP:    Pulse: (!) 110   Resp: (!) 26   Temp:    SpO2: 91% 95%

## 2023-09-22 NOTE — ED Provider Notes (Addendum)
[facility]   [id number] 09/22/23 Arrival Time: 1226  ASSESSMENT & PLAN:  1. SOB (shortness of breath)   2. Mild intermittent asthma with acute exacerbation   3. Community acquired pneumonia of right upper lobe of lung    I have personally viewed and independently interpreted the imaging studies ordered this visit. CXR: RUL pneumonia.  Despite duoneb, depo-medrol, IVF she remains SOB, tachypneic, tachycardic even with temperature reduction. Cannot r/o sepsis. BP stable. Social issues: unsure if she would be able to get medications prescribed below secondary to lack of transportation.  SpO2 around 91-92% on RA, 95% on 2L [place]. She is agreeable to ED evaluation. To ED via EMS. Stable upon discharge.  Meds ordered this encounter  Medications   ipratropium-albuterol (DUONEB) 0.5-2.5 (3) MG/3ML nebulizer solution 3 mL   methylPREDNISolone acetate (DEPO-MEDROL) injection 80 mg   cefTRIAXone (ROCEPHIN) injection 1 g               ibuprofen (ADVIL) tablet 800 mg   sodium chloride 0.9 % bolus 1,000 mL    Cough medication sedation precautions. Discussed typical duration of symptoms. OTC symptom care as needed. Ensure adequate fluid intake and rest. May f/u with PCP or here as needed.  Reviewed expectations re: course of current medical issues. Questions answered. Outlined signs and symptoms indicating need for more acute intervention. Patient verbalized understanding. After Visit Summary given.   SUBJECTIVE: History from: patient.  Amy Rowe is a 48 y.o. female with PMH of asthma who presents with "trouble breathing"; past 2-3 weeks; worsening past few days; ques subj fever. Coughing more. Home nebs and inhalers without much relief. Feels SOB:  Denies CP/n/v/diaphoresis.  Social History   Tobacco Use  Smoking Status Former   Current packs/day: 0.50   Average packs/day: 0.5 packs/day for 5.0 years (2.5 ttl pk-yrs)   Types: Cigarettes  Smokeless Tobacco  Never    OBJECTIVE:  Vitals:   09/22/23 1435 09/22/23 1455 09/22/23 1611 09/22/23 1635  BP: 110/71     Pulse: (!) 117  (!) 112 (!) 110  Resp: (!) 32  (!) 28   Temp:  (!) 102.7 F (39.3 C) (!) 101.7 F (38.7 C)   TempSrc:  Oral    SpO2: 97%  94% 91%    General appearance: alert; appears fatigued Neck: supple without LAD CV: regular; tachycardic Lungs: labored respirations with tachypnea, bilateral expiratory wheezing present; decreased lung sounds over RUL Abd: soft Ext: no LE edema Skin: warm and dry Psychological: alert and cooperative; normal mood and affect  Imaging: DG Chest 2 View Result Date: 09/22/2023 CLINICAL DATA:  Shortness of breath. EXAM: CHEST - 2 VIEW COMPARISON:  August 04, 2021. FINDINGS: The heart size and mediastinal contours are within normal limits. Large right upper lobe opacity is noted concerning for pneumonia. Mild bibasilar atelectasis or infiltrates are noted as well. The visualized skeletal structures are unremarkable. IMPRESSION: Large right upper lobe opacity is noted concerning for pneumonia. Bibasilar atelectasis or infiltrates are noted as well. Followup PA and lateral chest X-ray is recommended in 3-4 weeks following trial of antibiotic therapy to ensure resolution and exclude underlying malignancy. Electronically Signed   By: Lupita Raider M.D.   On: 09/22/2023 14:31    Allergies  Allergen Reactions   Other     ANTIBIOTIC-Name is unknown. States that reaction occurred along time ago that included having an infection after   Latex Swelling and Rash    Past Medical History:  Diagnosis Date  Anxiety    Asthma    Bronchitis    Depression    History reviewed. No pertinent family history. Social History   Socioeconomic History   Marital status: Divorced    Spouse name: Not on file   Number of children: Not on file   Years of education: Not on file   Highest education level: Not on file  Occupational History   Not on file   Tobacco Use   Smoking status: Former    Current packs/day: 0.50    Average packs/day: 0.5 packs/day for 5.0 years (2.5 ttl pk-yrs)    Types: Cigarettes   Smokeless tobacco: Never  Vaping Use   Vaping status: Former  Substance and Sexual Activity   Alcohol use: Not Currently    Comment: socially   Drug use: No   Sexual activity: Not Currently  Other Topics Concern   Not on file  Social History Narrative   Not on file   Social Drivers of Health   Financial Resource Strain: High Risk (08/22/2023)   Overall Financial Resource Strain (CARDIA)    Difficulty of Paying Living Expenses: Hard  Food Insecurity: Food Insecurity Present (08/22/2023)   Hunger Vital Sign    Worried About Running Out of Food in the Last Year: Sometimes true    Ran Out of Food in the Last Year: Sometimes true  Transportation Needs: Unmet Transportation Needs (08/22/2023)   PRAPARE - Transportation    Lack of Transportation (Medical): No    Lack of Transportation (Non-Medical): Yes  Physical Activity: Sufficiently Active (08/22/2023)   Exercise Vital Sign    Days of Exercise per Week: 7 days    Minutes of Exercise per Session: 30 min  Stress: Stress Concern Present (08/22/2023)   [employer] of Occupational Health - Occupational Stress Questionnaire    Feeling of Stress : Rather much  Social Connections: Socially Isolated (08/22/2023)   Social Connection and Isolation Panel [NHANES]    Frequency of Communication with Friends and Family: More than three times a week    Frequency of Social Gatherings with Friends and Family: Never    Attends Religious Services: Never    Database administrator or Organizations: No    Attends Banker Meetings: Never    Marital Status: Divorced  Catering manager Violence: Not At Risk (08/22/2023)   Humiliation, Afraid, Rape, and Kick questionnaire    Fear of Current or Ex-Partner: No    Emotionally Abused: No    Physically Abused: No    Sexually  Abused: No            Mardella Layman, MD 09/22/23 1654    Mardella Layman, MD 09/22/23 1719    Mardella Layman, MD 09/22/23 1741

## 2023-09-22 NOTE — ED Triage Notes (Signed)
Pt BIBA from UC, c/o SOB, increased lethargy, fever and diarrhea. Started 2 weeks ago. Had fever 101.7 at [facility] and was tachy there too. Pneumonia confirmed on XR at [facility].  UC gave Duoneb, methylprednisolone, rocephin, ibuprofen, and NS bolus.   BP 102/80 Eto2 20

## 2023-09-22 NOTE — ED Notes (Signed)
 RN notified of lactic result.

## 2023-09-22 NOTE — Discharge Instructions (Addendum)
Meds ordered this encounter  Medications   ipratropium-albuterol (DUONEB) 0.5-2.5 (3) MG/3ML nebulizer solution 3 mL   methylPREDNISolone acetate (DEPO-MEDROL) injection 80 mg   cefTRIAXone (ROCEPHIN) injection 1 g   levofloxacin (LEVAQUIN) 750 MG tablet    Sig: Take 1 tablet (750 mg total) by mouth daily.    Dispense:  5 tablet    Refill:  0   HYDROcodone bit-homatropine (HYCODAN) 5-1.5 MG/5ML syrup    Sig: Take 5 mLs by mouth every 6 (six) hours as needed for cough.    Dispense:  90 mL    Refill:  0   methylPREDNISolone (MEDROL DOSEPAK) 4 MG TBPK tablet    Sig: Take as directed.    Dispense:  1 each    Refill:  0   ibuprofen (ADVIL) tablet 800 mg   sodium chloride 0.9 % bolus 1,000 mL

## 2023-09-23 DIAGNOSIS — J1000 Influenza due to other identified influenza virus with unspecified type of pneumonia: Secondary | ICD-10-CM | POA: Diagnosis not present

## 2023-09-23 LAB — GASTROINTESTINAL PANEL BY PCR, STOOL (REPLACES STOOL CULTURE)

## 2023-09-23 LAB — URINALYSIS, W/ REFLEX TO CULTURE (INFECTION SUSPECTED)
Bilirubin Urine: NEGATIVE
Glucose, UA: NEGATIVE mg/dL
Ketones, ur: NEGATIVE mg/dL
Leukocytes,Ua: NEGATIVE
Nitrite: NEGATIVE
Protein, ur: NEGATIVE mg/dL
Specific Gravity, Urine: 1.008 (ref 1.005–1.030)
pH: 5 (ref 5.0–8.0)

## 2023-09-23 LAB — HIV ANTIBODY (ROUTINE TESTING W REFLEX): HIV Screen 4th Generation wRfx: NONREACTIVE

## 2023-09-23 LAB — CBC
HCT: 33.3 % — ABNORMAL LOW (ref 36.0–46.0)
Hemoglobin: 10.9 g/dL — ABNORMAL LOW (ref 12.0–15.0)
MCH: 27.5 pg (ref 26.0–34.0)
MCHC: 32.7 g/dL (ref 30.0–36.0)
MCV: 84.1 fL (ref 80.0–100.0)
Platelets: 177 10*3/uL (ref 150–400)
RBC: 3.96 MIL/uL (ref 3.87–5.11)
RDW: 14.6 % (ref 11.5–15.5)
WBC: 6.5 10*3/uL (ref 4.0–10.5)
nRBC: 0 % (ref 0.0–0.2)

## 2023-09-23 LAB — COMPREHENSIVE METABOLIC PANEL
ALT: 35 U/L (ref 0–44)
AST: 42 U/L — ABNORMAL HIGH (ref 15–41)
Albumin: 2.3 g/dL — ABNORMAL LOW (ref 3.5–5.0)
Alkaline Phosphatase: 53 U/L (ref 38–126)
Anion gap: 10 (ref 5–15)
BUN: 25 mg/dL — ABNORMAL HIGH (ref 6–20)
CO2: 23 mmol/L (ref 22–32)
Calcium: 8.1 mg/dL — ABNORMAL LOW (ref 8.9–10.3)
Chloride: 104 mmol/L (ref 98–111)
Creatinine, Ser: 1.15 mg/dL — ABNORMAL HIGH (ref 0.44–1.00)
GFR, Estimated: 58 mL/min — ABNORMAL LOW (ref >60)
Glucose, Bld: 164 mg/dL — ABNORMAL HIGH (ref 70–99)
Potassium: 3.1 mmol/L — ABNORMAL LOW (ref 3.5–5.1)
Sodium: 137 mmol/L (ref 135–145)
Total Bilirubin: 0.8 mg/dL (ref 0.0–1.2)
Total Protein: 6 g/dL — ABNORMAL LOW (ref 6.5–8.1)

## 2023-09-23 LAB — SODIUM, URINE, RANDOM: Sodium, Ur: <10 mmol/L

## 2023-09-23 LAB — LACTIC ACID, PLASMA
Lactic Acid, Venous: 1.1 mmol/L (ref 0.5–1.9)
Lactic Acid, Venous: 2 mmol/L (ref 0.5–1.9)
Lactic Acid, Venous: 2.1 mmol/L (ref 0.5–1.9)

## 2023-09-23 LAB — CREATININE, URINE, RANDOM: Creatinine, Urine: 68 mg/dL

## 2023-09-23 LAB — STREP PNEUMONIAE URINARY ANTIGEN: Strep Pneumo Urinary Antigen: POSITIVE — AB

## 2023-09-23 MED ORDER — GUAIFENESIN ER 600 MG PO TB12
600.0000 mg | ORAL_TABLET | Freq: Two times a day (BID) | ORAL | Status: DC
  Administered 2023-09-23 – 2023-09-25 (×6): 600 mg via ORAL
  Filled 2023-09-23 (×6): qty 1

## 2023-09-23 MED ORDER — OSELTAMIVIR PHOSPHATE 75 MG PO CAPS
75.0000 mg | ORAL_CAPSULE | Freq: Two times a day (BID) | ORAL | Status: DC
  Administered 2023-09-23 – 2023-09-25 (×5): 75 mg via ORAL
  Filled 2023-09-23 (×5): qty 1

## 2023-09-23 MED ORDER — POTASSIUM CHLORIDE CRYS ER 20 MEQ PO TBCR: 40.0000 meq | EXTENDED_RELEASE_TABLET | Freq: Two times a day (BID) | ORAL | Status: DC

## 2023-09-23 MED ORDER — OSELTAMIVIR PHOSPHATE 75 MG PO CAPS: 75.0000 mg | ORAL_CAPSULE | Freq: Two times a day (BID) | ORAL | Status: DC

## 2023-09-23 MED ORDER — POTASSIUM CHLORIDE CRYS ER 20 MEQ PO TBCR
40.0000 meq | EXTENDED_RELEASE_TABLET | Freq: Once | ORAL | Status: AC
  Administered 2023-09-23: 40 meq via ORAL
  Filled 2023-09-23: qty 2

## 2023-09-23 NOTE — Progress Notes (Signed)
PROGRESS NOTE  Amy Rowe QIO:[id number] DOB: [birth year] DOA: 09/22/2023 PCP: Pcp, No   LOS: 1 day   Brief Narrative / Interim history: 48 year old female with history of asthma, GAD, prior tobacco use now in remission, depression, schizophrenia comes into the hospital with shortness of breath.  She has not been feeling good for several days with URI, cough, congestion, increased wheezing as well as increased shortness of breath with exertion.  She was found to be hypoxic, imaging in the ED showed right upper lobe opacity concerning for pneumonia.  She tested positive for influenza A and was admitted to the hospital  Subjective / 24h Interval events: She tells me that she is feeling better this morning.  States that her breathing is better but still short of breath with minimal activities.  Assesement and Plan: Principal Problem:   Pneumonia Active Problems:   Asthma, chronic obstructive, with acute exacerbation (HCC)   Sepsis due to pneumonia (HCC)   CAP (community acquired pneumonia)   AKI (acute kidney injury) (HCC)   Influenza A with pneumonia   Hyponatremia   Metabolic acidosis   Hypokalemia   Generalized anxiety disorder  Principal problem Sepsis secondary to right upper lobe pneumococcal pneumonia, influenza pneumonia -imaging on admission showed right upper lobe opacity, concerning for pneumonia.  She tested positive for influenza A, has been placed on Tamiflu, continue for total of 5 days -Urinary strep antigen is also positive, suggesting superimposed bacterial pneumonia, has been placed on antibiotics, continue for total of 5 days  Active problems Acute hypoxic respiratory failure in the setting of asthma exacerbation secondary to pneumonia - continue oxygen to maintain sats above 90%, wean off to room air as tolerated.  Had wheezing on admission, has been placed on steroids, continue.  Continue nebulizers as well  Acute kidney injury-creatinine 1.59 on admission,  improving with fluids.  Continue to monitor  Hypokalemia-replace potassium and continue to monitor  Hyponatremia-due to dehydration, sodium normalized with fluids  Diarrhea-secondary to influenza A.  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  She would benefit from weight loss  Scheduled Meds:  citalopram  20 mg Oral Daily   enoxaparin (LOVENOX) injection  50 mg Subcutaneous Q24H   guaiFENesin  600 mg Oral BID   ipratropium-albuterol  3 mL Nebulization QID   methylPREDNISolone (SOLU-MEDROL) injection  40 mg Intravenous Q24H   OLANZapine  10 mg Oral QHS   oseltamivir  75 mg Oral BID   pantoprazole  40 mg Oral Daily   sodium chloride flush  3 mL Intravenous Q12H   Continuous Infusions:  sodium chloride     cefTRIAXone (ROCEPHIN)  IV     doxycycline (VIBRAMYCIN) IV Stopped (09/23/23 0836)   lactated ringers 150 mL/hr at 09/22/23 2118   PRN Meds:.sodium chloride, acetaminophen **OR** acetaminophen, albuterol, ondansetron **OR** ondansetron (ZOFRAN) IV, senna-docusate, sodium chloride flush  Current Outpatient Medications  Medication Instructions   acetaminophen (TYLENOL) 650 mg, Oral, Every 6 hours PRN   albuterol (PROVENTIL) 2.5 mg, Nebulization, Every 4 hours PRN   albuterol (VENTOLIN HFA) 108 (90 Base) MCG/ACT inhaler 2 puffs, Inhalation, Every 6 hours PRN   benztropine (COGENTIN) 0.5 mg, Oral, 2 times daily PRN   citalopram (CELEXA) 20 mg, Oral, Daily   ePHEDrine-guaiFENesin (PRIMATENE ASTHMA PO) 1 tablet, Daily PRN   fluticasone (FLONASE) 50 MCG/ACT nasal spray 1 spray, Each Nare, Daily PRN   HYDROcodone bit-homatropine (  HYCODAN) 5-1.5 MG/5ML syrup 5 mLs, Oral, Every 6 hours PRN   levofloxacin (LEVAQUIN) 750 mg, Oral, Daily   methylPREDNISolone (MEDROL DOSEPAK) 4 MG TBPK tablet Take as directed.   OLANZapine (ZYPREXA) 10 mg, Oral, Daily at bedtime    traZODone (DESYREL) 100 mg, Oral, At bedtime PRN    Diet Orders (From admission, onward)     Start     Ordered   09/22/23 2137  Diet Heart Room service appropriate? Yes; Fluid consistency: Thin  Diet effective now       Question Answer Comment  Room service appropriate? Yes   Fluid consistency: Thin      09/22/23 2136            DVT prophylaxis: SCDs Start: 09/22/23 2137 Place TED hose Start: 09/22/23 2137   Lab Results  Component Value Date   PLT 177 09/23/2023      Code Status: Full Code  Family Communication: No family at bedside  Status is: Inpatient Remains inpatient appropriate because: Requires oxygen, IV antibiotics   Level of care: Telemetry  Consultants:  None  Objective: Vitals:   09/23/23 0600 09/23/23 0743 09/23/23 0813 09/23/23 0956  BP: (!) 166/127   117/88  Pulse: 95   (!) 105  Resp: 13   (!) 37  Temp:   (!) 97.3 F (36.3 C) 97.9 F (36.6 C)  TempSrc:   Oral Oral  SpO2: 100% 97%  99%  Weight:      Height:       No intake or output data in the 24 hours ending 09/23/23 1026 Wt Readings from Last 3 Encounters:  09/22/23 106.6 kg  08/04/21 94.8 kg  12/08/20 89.8 kg    Examination:  Constitutional: NAD Eyes: no scleral icterus ENMT: Mucous membranes are moist.  Neck: normal, supple Respiratory: Faint rhonchi on the right side, no wheezing Cardiovascular: Regular rate and rhythm, no murmurs / rubs / gallops. No LE edema.  Abdomen: non distended, no tenderness. Bowel sounds positive.  Musculoskeletal: no clubbing / cyanosis.   Data Reviewed: I have independently reviewed following labs and imaging studies   CBC Recent Labs  Lab 09/22/23 1848 09/23/23 0434  WBC 6.0 6.5  HGB 12.1 10.9*  HCT 37.4 33.3*  PLT 186 177  MCV 83.9 84.1  MCH 27.1 27.5  MCHC 32.4 32.7  RDW 14.5 14.6  LYMPHSABS 0.9  --   MONOABS 0.3  --   EOSABS 0.0  --   BASOSABS 0.0  --     Recent Labs  Lab 09/22/23 1848 09/22/23 1856 09/22/23 2023  09/22/23 2126 09/22/23 2335 09/23/23 0425 09/23/23 0434  NA 131*  --   --   --  134*  --  137  K 2.8*  --   --   --  3.0*  --  3.1*  CL 99  --   --   --  103  --  104  CO2 16*  --   --   --  21*  --  23  GLUCOSE 178*  --   --   --  150*  --  164*  BUN 27*  --   --   --  26*  --  25*  CREATININE 1.59*  --   --   --  1.18*  --  1.15*  CALCIUM 7.7*  --   --   --  7.6*  --  8.1*  AST 57*  --   --   --   --   --  42*  ALT 42  --   --   --   --   --  35  ALKPHOS 53  --   --   --   --   --  53  BILITOT 1.1  --   --   --   --   --  0.8  ALBUMIN 2.7*  --   --   --   --   --  2.3*  LATICACIDVEN  --  3.6*  --  3.2* 2.1* 2.0*  --   INR  --   --  1.2  --   --   --   --     ------------------------------------------------------------------------------------------------------------------ No results for input(s): "CHOL", "HDL", "LDLCALC", "TRIG", "CHOLHDL", "LDLDIRECT" in the last 72 hours.  Lab Results  Component Value Date   HGBA1C 5.6 10/06/2020   ------------------------------------------------------------------------------------------------------------------ No results for input(s): "TSH", "T4TOTAL", "T3FREE", "THYROIDAB" in the last 72 hours.  Invalid input(s): "FREET3"  Cardiac Enzymes No results for input(s): "CKMB", "TROPONINI", "MYOGLOBIN" in the last 168 hours.  Invalid input(s): "CK" ------------------------------------------------------------------------------------------------------------------ No results found for: "BNP"  CBG: No results for input(s): "GLUCAP" in the last 168 hours.  Recent Results (from the past 240 hours)  Blood Culture (routine x 2)     Status: None (Preliminary result)   Collection Time: 09/22/23  6:48 PM   Specimen: BLOOD  Result Value Ref Range Status   Specimen Description   Final    BLOOD BLOOD LEFT WRIST Performed at [facility], [address], [place], [place] [address]    Special Requests   Final    BOTTLES DRAWN  AEROBIC AND ANAEROBIC Blood Culture results may not be optimal due to an inadequate volume of blood received in culture bottles Performed at [facility], [address], [place], [place] [address]    Culture   Final    NO GROWTH < 12 HOURS Performed at [facility], [address], [place], [place] [address]    Report Status PENDING  Incomplete  Resp panel by RT-PCR (RSV, Flu A&B, Covid)     Status: Abnormal   Collection Time: 09/22/23  6:57 PM   Specimen: Nasal Swab  Result Value Ref Range Status   SARS Coronavirus 2 by RT PCR NEGATIVE NEGATIVE Final    Comment: (NOTE) SARS-CoV-2 target nucleic acids are NOT DETECTED.  The SARS-CoV-2 RNA is generally detectable in upper respiratory specimens during the acute phase of infection. The lowest concentration of SARS-CoV-2 viral copies this assay can detect is 138 copies/mL. A negative result does not preclude SARS-Cov-2 infection and should not be used as the sole basis for treatment or other patient management decisions. A negative result may occur with  improper specimen collection/handling, submission of specimen other than nasopharyngeal swab, presence of viral mutation(s) within the areas targeted by this assay, and inadequate number of viral copies(<138 copies/mL). A negative result must be combined with clinical observations, patient history, and epidemiological information. The expected result is Negative.  Fact Sheet for Patients:  BloggerCourse.com  Fact Sheet for Healthcare Providers:  SeriousBroker.it  This test is no t yet approved or cleared by the [place] FDA and  has been authorized for detection and/or diagnosis of SARS-CoV-2 by FDA under an Emergency Use Authorization (EUA). This EUA will remain  in effect (meaning this test can be used) for the duration of the COVID-19 declaration under Section 564(b)(1) of the Act, 21 U.S.C.section  360bbb-3(b)(1), unless the authorization  is terminated  or revoked sooner.       Influenza A by PCR POSITIVE (A) NEGATIVE Final   Influenza B by PCR NEGATIVE NEGATIVE Final    Comment: (NOTE) The Xpert Xpress SARS-CoV-2/FLU/RSV plus assay is intended as an aid in the diagnosis of influenza from Nasopharyngeal swab specimens and should not be used as a sole basis for treatment. Nasal washings and aspirates are unacceptable for Xpert Xpress SARS-CoV-2/FLU/RSV testing.  Fact Sheet for Patients: BloggerCourse.com  Fact Sheet for Healthcare Providers: SeriousBroker.it  This test is not yet approved or cleared by the [place] FDA and has been authorized for detection and/or diagnosis of SARS-CoV-2 by FDA under an Emergency Use Authorization (EUA). This EUA will remain in effect (meaning this test can be used) for the duration of the COVID-19 declaration under Section 564(b)(1) of the Act, 21 U.S.C. section 360bbb-3(b)(1), unless the authorization is terminated or revoked.     Resp Syncytial Virus by PCR NEGATIVE NEGATIVE Final    Comment: (NOTE) Fact Sheet for Patients: BloggerCourse.com  Fact Sheet for Healthcare Providers: SeriousBroker.it  This test is not yet approved or cleared by the [place] FDA and has been authorized for detection and/or diagnosis of SARS-CoV-2 by FDA under an Emergency Use Authorization (EUA). This EUA will remain in effect (meaning this test can be used) for the duration of the COVID-19 declaration under Section 564(b)(1) of the Act, 21 U.S.C. section 360bbb-3(b)(1), unless the authorization is terminated or revoked.  Performed at [facility], [address] [place], [place] [address]   Blood Culture (routine x 2)     Status: None (Preliminary result)   Collection Time: 09/22/23  7:04 PM   Specimen: BLOOD  Result  Value Ref Range Status   Specimen Description   Final    BLOOD BLOOD LEFT FOREARM Performed at [facility], [address], [place], [place] [address]    Special Requests   Final    BOTTLES DRAWN AEROBIC AND ANAEROBIC Blood Culture adequate volume Performed at [facility], [address] [place], [place] [address]    Culture   Final    NO GROWTH < 12 HOURS Performed at [facility], [address], [place], [place] [address]    Report Status PENDING  Incomplete     Radiology Studies: DG Chest 2 View Result Date: 09/22/2023 CLINICAL DATA:  Shortness of breath. EXAM: CHEST - 2 VIEW COMPARISON:  August 04, 2021. FINDINGS: The heart size and mediastinal contours are within normal limits. Large right upper lobe opacity is noted concerning for pneumonia. Mild bibasilar atelectasis or infiltrates are noted as well. The visualized skeletal structures are unremarkable. IMPRESSION: Large right upper lobe opacity is noted concerning for pneumonia. Bibasilar atelectasis or infiltrates are noted as well. Followup PA and lateral chest X-ray is recommended in 3-4 weeks following trial of antibiotic therapy to ensure resolution and exclude underlying malignancy. Electronically Signed   By: Lupita Raider M.D.   On: 09/22/2023 14:31     Pamella Pert, MD, PhD [facility]  Between 7 am - 7 pm I am available, please contact me via Amion (for emergencies) or Securechat (non urgent messages)  Between 7 pm - 7 am I am not available, please contact night coverage MD/APP via Amion

## 2023-09-24 DIAGNOSIS — J1000 Influenza due to other identified influenza virus with unspecified type of pneumonia: Secondary | ICD-10-CM | POA: Diagnosis not present

## 2023-09-24 LAB — COMPREHENSIVE METABOLIC PANEL
ALT: 57 U/L — ABNORMAL HIGH (ref 0–44)
AST: 94 U/L — ABNORMAL HIGH (ref 15–41)
Albumin: 2.2 g/dL — ABNORMAL LOW (ref 3.5–5.0)
Alkaline Phosphatase: 77 U/L (ref 38–126)
Anion gap: 6 (ref 5–15)
BUN: 20 mg/dL (ref 6–20)
CO2: 22 mmol/L (ref 22–32)
Calcium: 8.1 mg/dL — ABNORMAL LOW (ref 8.9–10.3)
Chloride: 111 mmol/L (ref 98–111)
Creatinine, Ser: 0.92 mg/dL (ref 0.44–1.00)
GFR, Estimated: >60 mL/min (ref >60)
Glucose, Bld: 154 mg/dL — ABNORMAL HIGH (ref 70–99)
Potassium: 3.3 mmol/L — ABNORMAL LOW (ref 3.5–5.1)
Sodium: 139 mmol/L (ref 135–145)
Total Bilirubin: 0.6 mg/dL (ref 0.0–1.2)
Total Protein: 6 g/dL — ABNORMAL LOW (ref 6.5–8.1)

## 2023-09-24 LAB — CBC
HCT: 29.8 % — ABNORMAL LOW (ref 36.0–46.0)
Hemoglobin: 9.7 g/dL — ABNORMAL LOW (ref 12.0–15.0)
MCH: 27.6 pg (ref 26.0–34.0)
MCHC: 32.6 g/dL (ref 30.0–36.0)
MCV: 84.9 fL (ref 80.0–100.0)
Platelets: 217 10*3/uL (ref 150–400)
RBC: 3.51 MIL/uL — ABNORMAL LOW (ref 3.87–5.11)
RDW: 15 % (ref 11.5–15.5)
WBC: 7.2 10*3/uL (ref 4.0–10.5)
nRBC: 0 % (ref 0.0–0.2)

## 2023-09-24 LAB — PHOSPHORUS: Phosphorus: 1.5 mg/dL — ABNORMAL LOW (ref 2.5–4.6)

## 2023-09-24 LAB — MAGNESIUM: Magnesium: 2.1 mg/dL (ref 1.7–2.4)

## 2023-09-24 MED ORDER — POTASSIUM PHOSPHATES 15 MMOLE/5ML IV SOLN: 30.0000 mmol | Freq: Once | INTRAVENOUS | Status: DC

## 2023-09-24 MED ORDER — DOXYCYCLINE HYCLATE 100 MG PO TABS
100.0000 mg | ORAL_TABLET | Freq: Two times a day (BID) | ORAL | Status: DC
  Administered 2023-09-24 – 2023-09-25 (×2): 100 mg via ORAL
  Filled 2023-09-24 (×2): qty 1

## 2023-09-24 MED ORDER — POTASSIUM CHLORIDE CRYS ER 20 MEQ PO TBCR
40.0000 meq | EXTENDED_RELEASE_TABLET | Freq: Once | ORAL | Status: AC
  Administered 2023-09-24: 40 meq via ORAL
  Filled 2023-09-24: qty 2

## 2023-09-24 MED ORDER — ENOXAPARIN SODIUM 40 MG/0.4ML IJ SOSY
40.0000 mg | PREFILLED_SYRINGE | INTRAMUSCULAR | Status: DC
  Administered 2023-09-25: 40 mg via SUBCUTANEOUS
  Filled 2023-09-24: qty 0.4

## 2023-09-24 MED ORDER — POTASSIUM PHOSPHATES 15 MMOLE/5ML IV SOLN
30.0000 mmol | Freq: Once | INTRAVENOUS | Status: AC
  Administered 2023-09-24: 30 mmol via INTRAVENOUS
  Filled 2023-09-24: qty 10

## 2023-09-24 NOTE — Plan of Care (Signed)
No acute events this shift. The patient has rested throughout the shift without complaints of pain. She has ambulated in the hall multiple times independently and without difficulty. She denies any difficulty breathing or SOB. VSS. Fall precautions in place. Will continue to monitor.  Problem: Education: Goal: Knowledge of General Education information will improve Description: Including pain rating scale, medication(s)/side effects and non-pharmacologic comfort measures Outcome: Progressing   Problem: Health Behavior/Discharge Planning: Goal: Ability to manage health-related needs will improve Outcome: Progressing   Problem: Clinical Measurements: Goal: Ability to maintain clinical measurements within normal limits will improve Outcome: Progressing Goal: Will remain free from infection Outcome: Progressing Goal: Diagnostic test results will improve Outcome: Progressing Goal: Respiratory complications will improve Outcome: Progressing Goal: Cardiovascular complication will be avoided Outcome: Progressing   Problem: Activity: Goal: Risk for activity intolerance will decrease Outcome: Progressing   Problem: Nutrition: Goal: Adequate nutrition will be maintained Outcome: Progressing   Problem: Coping: Goal: Level of anxiety will decrease Outcome: Progressing   Problem: Elimination: Goal: Will not experience complications related to bowel motility Outcome: Progressing Goal: Will not experience complications related to urinary retention Outcome: Progressing   Problem: Pain Managment: Goal: General experience of comfort will improve and/or be controlled Outcome: Progressing   Problem: Safety: Goal: Ability to remain free from injury will improve Outcome: Progressing   Problem: Skin Integrity: Goal: Risk for impaired skin integrity will decrease Outcome: Progressing   Problem: Activity: Goal: Ability to tolerate increased activity will improve Outcome: Progressing    Problem: Clinical Measurements: Goal: Ability to maintain a body temperature in the normal range will improve Outcome: Progressing   Problem: Respiratory: Goal: Ability to maintain adequate ventilation will improve Outcome: Progressing Goal: Ability to maintain a clear airway will improve Outcome: Progressing

## 2023-09-24 NOTE — Progress Notes (Signed)
PROGRESS NOTE  Amy Rowe ZOX:[id number] DOB: 03-14-1976 DOA: 09/22/2023 PCP: Pcp, No   LOS: 2 days   Brief Narrative / Interim history: 48 year old female with history of asthma, GAD, prior tobacco use now in remission, depression, schizophrenia comes into the hospital with shortness of breath.  She has not been feeling good for several days with URI, cough, congestion, increased wheezing as well as increased shortness of breath with exertion.  She was found to be hypoxic, imaging in the ED showed right upper lobe opacity concerning for pneumonia.  She tested positive for influenza A and was admitted to the hospital  Subjective / 24h Interval events: Feeling better, still has some cough but overall improved.  Has only walked in the room  Assesement and Plan: Principal Problem:   Pneumonia Active Problems:   Asthma, chronic obstructive, with acute exacerbation (HCC)   Sepsis due to pneumonia (HCC)   CAP (community acquired pneumonia)   AKI (acute kidney injury) (HCC)   Influenza A with pneumonia   Hyponatremia   Metabolic acidosis   Hypokalemia   Generalized anxiety disorder  Principal problem Sepsis secondary to right upper lobe pneumococcal pneumonia, influenza pneumonia -imaging on admission showed right upper lobe opacity, concerning for pneumonia.  She tested positive for influenza A, has been placed on Tamiflu, continue for total of 5 days -Urinary strep antigen is also positive, suggesting superimposed bacterial pneumonia, has been placed on antibiotics, continue for total of 5 days  Active problems Acute hypoxic respiratory failure in the setting of asthma exacerbation secondary to pneumonia - continue oxygen to maintain sats above 90%, wean off to room air as tolerated.  Had wheezing on admission, has been placed on steroids, continue.  Continue nebulizers as well -Currently on room air this morning, have asked patient to ambulate more in the hallway today  Acute  kidney injury-creatinine 1.59 on admission, received fluids, creatinine now normalized  Hypokalemia, hypophosphatemia-replace potassium and phos, continue to monitor  Hyponatremia-due to dehydration, sodium normalized with fluids  Diarrhea-secondary to influenza A.  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  She would benefit from weight loss  Scheduled Meds:  citalopram  20 mg Oral Daily   enoxaparin (LOVENOX) injection  50 mg Subcutaneous Q24H   guaiFENesin  600 mg Oral BID   ipratropium-albuterol  3 mL Nebulization QID   methylPREDNISolone (SOLU-MEDROL) injection  40 mg Intravenous Q24H   OLANZapine  10 mg Oral QHS   oseltamivir  75 mg Oral BID   pantoprazole  40 mg Oral Daily   sodium chloride flush  3 mL Intravenous Q12H   Continuous Infusions:  cefTRIAXone (ROCEPHIN)  IV 2 g (09/23/23 1703)   doxycycline (VIBRAMYCIN) IV 100 mg (09/24/23 0650)   PRN Meds:.acetaminophen **OR** acetaminophen, albuterol, ondansetron **OR** ondansetron (ZOFRAN) IV, senna-docusate, sodium chloride flush  Current Outpatient Medications  Medication Instructions   albuterol (PROVENTIL) 2.5 mg, Nebulization, Every 4 hours PRN   albuterol (VENTOLIN HFA) 108 (90 Base) MCG/ACT inhaler 2 puffs, Inhalation, Every 6 hours PRN   citalopram (CELEXA) 20 mg, Oral, Daily   ePHEDrine-guaiFENesin (PRIMATENE ASTHMA PO) 1 tablet, Daily PRN   fluticasone (FLONASE) 50 MCG/ACT nasal spray 1 spray, Each Nare, Daily PRN   HYDROcodone bit-homatropine (HYCODAN) 5-1.5 MG/5ML syrup 5 mLs, Oral, Every 6 hours PRN   levofloxacin (LEVAQUIN) 750 mg, Oral, Daily   methylPREDNISolone (MEDROL DOSEPAK) 4 MG TBPK tablet  Take as directed.   OLANZapine (ZYPREXA) 10 mg, Oral, Daily at bedtime   Pseudoeph-Doxylamine-DM-APAP (NYQUIL PO) 2 each, Every 4 hours PRN   traZODone (DESYREL) 100 mg, Oral, At  bedtime PRN    Diet Orders (From admission, onward)     Start     Ordered   09/22/23 2137  Diet Heart Room service appropriate? Yes; Fluid consistency: Thin  Diet effective now       Question Answer Comment  Room service appropriate? Yes   Fluid consistency: Thin      09/22/23 2136            DVT prophylaxis: SCDs Start: 09/22/23 2137 Place TED hose Start: 09/22/23 2137   Lab Results  Component Value Date   PLT 217 09/24/2023      Code Status: Full Code  Family Communication: No family at bedside  Status is: Inpatient Remains inpatient appropriate because: IV antibiotics, needs to ambulate,   Level of care: Telemetry  Consultants:  None  Objective: Vitals:   09/23/23 2112 09/23/23 2326 09/24/23 0422 09/24/23 0820  BP:  123/83 125/78   Pulse:  70 (!) 54   Resp:  19 19   Temp:  98.5 F (36.9 C) 98.2 F (36.8 C)   TempSrc:  Oral    SpO2: 97% 94% 97% 93%  Weight:      Height:        Intake/Output Summary (Last 24 hours) at 09/24/2023 0948 Last data filed at 09/23/2023 1505 Gross per 24 hour  Intake 1251.57 ml  Output --  Net 1251.57 ml   Wt Readings from Last 3 Encounters:  09/22/23 106.6 kg  08/04/21 94.8 kg  12/08/20 89.8 kg    Examination:  Constitutional: NAD Eyes: lids and conjunctivae normal, no scleral icterus ENMT: mmm Neck: normal, supple Respiratory: clear to auscultation bilaterally, no wheezing, no crackles. Normal respiratory effort.  Cardiovascular: Regular rate and rhythm, no murmurs / rubs / gallops. No LE edema. Abdomen: soft, no distention, no tenderness. Bowel sounds positive.   Data Reviewed: I have independently reviewed following labs and imaging studies   CBC Recent Labs  Lab 09/22/23 1848 09/23/23 0434 09/24/23 0729  WBC 6.0 6.5 7.2  HGB 12.1 10.9* 9.7*  HCT 37.4 33.3* 29.8*  PLT 186 177 217  MCV 83.9 84.1 84.9  MCH 27.1 27.5 27.6  MCHC 32.4 32.7 32.6  RDW 14.5 14.6 15.0  LYMPHSABS 0.9  --   --   MONOABS  0.3  --   --   EOSABS 0.0  --   --   BASOSABS 0.0  --   --     Recent Labs  Lab 09/22/23 1848 09/22/23 1856 09/22/23 2023 09/22/23 2126 09/22/23 2335 09/23/23 0425 09/23/23 0434 09/23/23 1620 09/24/23 0729  NA 131*  --   --   --  134*  --  137  --  139  K 2.8*  --   --   --  3.0*  --  3.1*  --  3.3*  CL 99  --   --   --  103  --  104  --  111  CO2 16*  --   --   --  21*  --  23  --  22  GLUCOSE 178*  --   --   --  150*  --  164*  --  154*  BUN 27*  --   --   --  26*  --  25*  --  20  CREATININE 1.59*  --   --   --  1.18*  --  1.15*  --  0.92  CALCIUM 7.7*  --   --   --  7.6*  --  8.1*  --  8.1*  AST 57*  --   --   --   --   --  42*  --  94*  ALT 42  --   --   --   --   --  35  --  57*  ALKPHOS 53  --   --   --   --   --  53  --  77  BILITOT 1.1  --   --   --   --   --  0.8  --  0.6  ALBUMIN 2.7*  --   --   --   --   --  2.3*  --  2.2*  MG  --   --   --   --   --   --   --   --  2.1  LATICACIDVEN  --  3.6*  --  3.2* 2.1* 2.0*  --  1.1  --   INR  --   --  1.2  --   --   --   --   --   --     ------------------------------------------------------------------------------------------------------------------ No results for input(s): "CHOL", "HDL", "LDLCALC", "TRIG", "CHOLHDL", "LDLDIRECT" in the last 72 hours.  Lab Results  Component Value Date   HGBA1C 5.6 10/06/2020   ------------------------------------------------------------------------------------------------------------------ No results for input(s): "TSH", "T4TOTAL", "T3FREE", "THYROIDAB" in the last 72 hours.  Invalid input(s): "FREET3"  Cardiac Enzymes No results for input(s): "CKMB", "TROPONINI", "MYOGLOBIN" in the last 168 hours.  Invalid input(s): "CK" ------------------------------------------------------------------------------------------------------------------ No results found for: "BNP"  CBG: No results for input(s): "GLUCAP" in the last 168 hours.  Recent Results (from the past 240 hours)  Blood  Culture (routine x 2)     Status: None (Preliminary result)   Collection Time: 09/22/23  6:48 PM   Specimen: BLOOD  Result Value Ref Range Status   Specimen Description   Final    BLOOD BLOOD LEFT WRIST Performed at [facility], [address] [place], [place] [address]    Special Requests   Final    BOTTLES DRAWN AEROBIC AND ANAEROBIC Blood Culture results may not be optimal due to an inadequate volume of blood received in culture bottles Performed at [facility], [address], [place], [place] [address]    Culture   Final    NO GROWTH 2 DAYS Performed at [facility], [address], [place], [place] [address]    Report Status PENDING  Incomplete  Resp panel by RT-PCR (RSV, Flu A&B, Covid)     Status: Abnormal   Collection Time: 09/22/23  6:57 PM   Specimen: Nasal Swab  Result Value Ref Range Status   SARS Coronavirus 2 by RT PCR NEGATIVE NEGATIVE Final    Comment: (NOTE) SARS-CoV-2 target nucleic acids are NOT DETECTED.  The SARS-CoV-2 RNA is generally detectable in upper respiratory specimens during the acute phase of infection. The lowest concentration of SARS-CoV-2 viral copies this assay can detect is 138 copies/mL. A negative result does not preclude SARS-Cov-2 infection and should not be used as the sole basis for treatment or other patient management decisions. A negative result may occur with  improper specimen collection/handling, submission of specimen other than nasopharyngeal swab, presence of  viral mutation(s) within the areas targeted by this assay, and inadequate number of viral copies(<138 copies/mL). A negative result must be combined with clinical observations, patient history, and epidemiological information. The expected result is Negative.  Fact Sheet for Patients:  BloggerCourse.com  Fact Sheet for Healthcare Providers:  SeriousBroker.it  This test is  no t yet approved or cleared by the [place] FDA and  has been authorized for detection and/or diagnosis of SARS-CoV-2 by FDA under an Emergency Use Authorization (EUA). This EUA will remain  in effect (meaning this test can be used) for the duration of the COVID-19 declaration under Section 564(b)(1) of the Act, 21 U.S.C.section 360bbb-3(b)(1), unless the authorization is terminated  or revoked sooner.       Influenza A by PCR POSITIVE (A) NEGATIVE Final   Influenza B by PCR NEGATIVE NEGATIVE Final    Comment: (NOTE) The Xpert Xpress SARS-CoV-2/FLU/RSV plus assay is intended as an aid in the diagnosis of influenza from Nasopharyngeal swab specimens and should not be used as a sole basis for treatment. Nasal washings and aspirates are unacceptable for Xpert Xpress SARS-CoV-2/FLU/RSV testing.  Fact Sheet for Patients: BloggerCourse.com  Fact Sheet for Healthcare Providers: SeriousBroker.it  This test is not yet approved or cleared by the [place] FDA and has been authorized for detection and/or diagnosis of SARS-CoV-2 by FDA under an Emergency Use Authorization (EUA). This EUA will remain in effect (meaning this test can be used) for the duration of the COVID-19 declaration under Section 564(b)(1) of the Act, 21 U.S.C. section 360bbb-3(b)(1), unless the authorization is terminated or revoked.     Resp Syncytial Virus by PCR NEGATIVE NEGATIVE Final    Comment: (NOTE) Fact Sheet for Patients: BloggerCourse.com  Fact Sheet for Healthcare Providers: SeriousBroker.it  This test is not yet approved or cleared by the [place] FDA and has been authorized for detection and/or diagnosis of SARS-CoV-2 by FDA under an Emergency Use Authorization (EUA). This EUA will remain in effect (meaning this test can be used) for the duration of the COVID-19 declaration under Section  564(b)(1) of the Act, 21 U.S.C. section 360bbb-3(b)(1), unless the authorization is terminated or revoked.  Performed at [facility], [address], [place], [place] [address]   Blood Culture (routine x 2)     Status: None (Preliminary result)   Collection Time: 09/22/23  7:04 PM   Specimen: BLOOD  Result Value Ref Range Status   Specimen Description   Final    BLOOD BLOOD LEFT FOREARM Performed at [facility], [address] [place], [place] [address]    Special Requests   Final    BOTTLES DRAWN AEROBIC AND ANAEROBIC Blood Culture adequate volume Performed at [facility], [address] [place], [place] [address]    Culture   Final    NO GROWTH 2 DAYS Performed at [facility], [address] [place], [place] [address]    Report Status PENDING  Incomplete  Gastrointestinal Panel by PCR , Stool     Status: None   Collection Time: 09/23/23  3:52 AM   Specimen: Stool  Result Value Ref Range Status   Campylobacter species NOT DETECTED NOT DETECTED Final   Plesimonas shigelloides NOT DETECTED NOT DETECTED Final   Salmonella species NOT DETECTED NOT DETECTED Final   Yersinia enterocolitica NOT DETECTED NOT DETECTED Final   Vibrio species NOT DETECTED NOT DETECTED Final   Vibrio cholerae NOT DETECTED NOT DETECTED Final  Enteroaggregative E coli (EAEC) NOT DETECTED NOT DETECTED Final   Enteropathogenic E coli (EPEC) NOT DETECTED NOT DETECTED Final   Enterotoxigenic E coli (ETEC) NOT DETECTED NOT DETECTED Final   Shiga like toxin producing E coli (STEC) NOT DETECTED NOT DETECTED Final   Shigella/Enteroinvasive E coli (EIEC) NOT DETECTED NOT DETECTED Final   Cryptosporidium NOT DETECTED NOT DETECTED Final   Cyclospora cayetanensis NOT DETECTED NOT DETECTED Final   Entamoeba histolytica NOT DETECTED NOT DETECTED Final   Giardia lamblia NOT DETECTED NOT DETECTED Final   Adenovirus F40/41 NOT DETECTED NOT DETECTED  Final   Astrovirus NOT DETECTED NOT DETECTED Final   Norovirus GI/GII NOT DETECTED NOT DETECTED Final   Rotavirus A NOT DETECTED NOT DETECTED Final   Sapovirus (I, II, IV, and V) NOT DETECTED NOT DETECTED Final    Comment: Performed at [facility], [address] [place], [place] [address]     Radiology Studies: No results found.    Pamella Pert, MD, PhD [facility]  Between 7 am - 7 pm I am available, please contact me via Amion (for emergencies) or Securechat (non urgent messages)  Between 7 pm - 7 am I am not available, please contact night coverage MD/APP via Amion

## 2023-09-24 NOTE — Plan of Care (Signed)
  Problem: Education: Goal: Knowledge of General Education information will improve Description: Including pain rating scale, medication(s)/side effects and non-pharmacologic comfort measures Outcome: Progressing   Problem: Clinical Measurements: Goal: Ability to maintain clinical measurements within normal limits will improve Outcome: Progressing Goal: Respiratory complications will improve Outcome: Progressing   Problem: Nutrition: Goal: Adequate nutrition will be maintained Outcome: Progressing   Problem: Pain Managment: Goal: General experience of comfort will improve and/or be controlled Outcome: Progressing   Problem: Safety: Goal: Ability to remain free from injury will improve Outcome: Progressing

## 2023-09-25 DIAGNOSIS — J1000 Influenza due to other identified influenza virus with unspecified type of pneumonia: Secondary | ICD-10-CM | POA: Diagnosis not present

## 2023-09-25 LAB — BASIC METABOLIC PANEL
Anion gap: 7 (ref 5–15)
BUN: 25 mg/dL — ABNORMAL HIGH (ref 6–20)
CO2: 19 mmol/L — ABNORMAL LOW (ref 22–32)
Calcium: 8.3 mg/dL — ABNORMAL LOW (ref 8.9–10.3)
Chloride: 110 mmol/L (ref 98–111)
Creatinine, Ser: 1.01 mg/dL — ABNORMAL HIGH (ref 0.44–1.00)
GFR, Estimated: >60 mL/min (ref >60)
Glucose, Bld: 194 mg/dL — ABNORMAL HIGH (ref 70–99)
Potassium: 4.3 mmol/L (ref 3.5–5.1)
Sodium: 136 mmol/L (ref 135–145)

## 2023-09-25 LAB — MAGNESIUM: Magnesium: 2.3 mg/dL (ref 1.7–2.4)

## 2023-09-25 LAB — PHOSPHORUS: Phosphorus: 3.3 mg/dL (ref 2.5–4.6)

## 2023-09-25 MED ORDER — OSELTAMIVIR PHOSPHATE 75 MG PO CAPS: 75.0000 mg | ORAL_CAPSULE | Freq: Two times a day (BID) | ORAL | 0 refills | Status: AC

## 2023-09-25 MED ORDER — IPRATROPIUM-ALBUTEROL 0.5-2.5 (3) MG/3ML IN SOLN: 3.0000 mL | Freq: Two times a day (BID) | RESPIRATORY_TRACT | Status: DC

## 2023-09-25 MED ORDER — DOXYCYCLINE HYCLATE 100 MG PO TABS: 100.0000 mg | ORAL_TABLET | Freq: Two times a day (BID) | ORAL | 0 refills | Status: DC

## 2023-09-25 MED ORDER — PREDNISONE 10 MG PO TABS: 20.0000 mg | ORAL_TABLET | Freq: Every day | ORAL | 0 refills | Status: AC

## 2023-09-25 MED ORDER — LEVOFLOXACIN 750 MG PO TABS: 750.0000 mg | ORAL_TABLET | Freq: Every day | ORAL | 0 refills | Status: AC

## 2023-09-25 NOTE — TOC Transition Note (Addendum)
Transition of Care [facility]) - Discharge Note   Patient Details  Name: Amy Rowe MRN: [id number] Date of Birth: [birth year]  Transition of [facility]) CM/SW Contact:  Maryjean Ka, LCSW Phone Number: 09/25/2023, 2:58 PM   Clinical Narrative:     This CSW met with patient at the bedside and introduced self as CSW and explained role. Patient shared that she needed transport back to her prior living environment. Patient reports that she and her daughter are currently living in a hotel and cost average around $430 a week which is a Academic libr[employer]. This CSW used active listening skills and provided empathy. Patient reports that she does not have a PCP. This CSW reviewed patient's insurance and encouraged patient to search the preferred PCP list within patient's insurance coverage. Patient verbalized understanding. This CSW encouraged patient to utilize community resources and patient agreed. Patient reported that her daughter is seeking long term housing. Patient is employed but does not have transportation. This CSW provided patient with a bus pass to return the community environment. Patient reports that she has access to her medications.SDOH needs addressed No other barriers identified.   Final next level of care: Home/Self Care Barriers to Discharge: No Barriers Identified   Patient Goals and CMS Choice Patient states their goals for this hospitalization and ongoing recovery are:: Patient reports to regain strength and to enjoy daily activities.          Discharge Placement                    Patient and family notified of of transfer: 09/25/23  Discharge Plan and Services Additional resources added to the After Visit Summary for                                       Social Drivers of Health (SDOH) Interventions SDOH Screenings   Food Insecurity: Food Insecurity Present (09/23/2023)  Housing: High Risk (09/23/2023)  Transportation Needs: No Transportation  Needs (09/23/2023)  Recent Concern: Transportation Needs - Unmet Transportation Needs (08/22/2023)  Utilities: Not At Risk (09/23/2023)  Alcohol Screen: Low Risk  (10/05/2020)  Depression (PHQ2-9): High Risk (08/22/2023)  Financial Resource Strain: High Risk (08/22/2023)  Physical Activity: Sufficiently Active (08/22/2023)  Social Connections: Socially Isolated (08/22/2023)  Stress: Stress Concern Present (08/22/2023)  Tobacco Use: Medium Risk (09/22/2023)  Health Literacy: Inadequate Health Literacy (08/22/2023)     Readmission Risk Interventions    09/25/2023    2:55 PM  Readmission Risk Prevention Plan  Transportation Screening Complete  PCP or Specialist Appt within 3-5 Days Complete  HRI or Home Care Consult Complete  Social Work Consult for Recovery Care Planning/Counseling Complete  Palliative Care Screening Not Applicable  Medication Review Oceanographer) Complete

## 2023-09-25 NOTE — Discharge Instructions (Signed)
Please continue Tamiflu and Levaquin for 3 additional days Continue steroids for 4 additional days  Follow with Pcp, No in 5-7 days  Please get a complete blood count and chemistry panel checked by your Primary MD at your next visit, and again as instructed by your Primary MD. Please get your medications reviewed and adjusted by your Primary MD.  Please request your Primary MD to go over all Hospital Tests and Procedure/Radiological results at the follow up, please get all Hospital records sent to your Prim MD by signing hospital release before you go home.  In some cases, there will be blood work, cultures and biopsy results pending at the time of your discharge. Please request that your primary care M.D. goes through all the records of your hospital data and follows up on these results.  If you had Pneumonia of Lung problems at the Hospital: Please get a 2 view Chest X ray done in 6-8 weeks after hospital discharge or sooner if instructed by your Primary MD.  If you have Congestive Heart Failure: Please call your Cardiologist or Primary MD anytime you have any of the following symptoms:  1) 3 pound weight gain in 24 hours or 5 pounds in 1 week  2) shortness of breath, with or without a dry hacking cough  3) swelling in the hands, feet or stomach  4) if you have to sleep on extra pillows at night in order to breathe  Follow cardiac low salt diet and 1.5 lit/day fluid restriction.  If you have diabetes Accuchecks 4 times/day, Once in AM empty stomach and then before each meal. Log in all results and show them to your primary doctor at your next visit. If any glucose reading is under 80 or above 300 call your primary MD immediately.  If you have Seizure/Convulsions/Epilepsy: Please do not drive, operate heavy machinery, participate in activities at heights or participate in high speed sports until you have seen by Primary MD or a Neurologist and advised to do so again. Per [facility] statutes, patients with seizures are not allowed to drive until they have been seizure-free for six months.  Use caution when using heavy equipment or power tools. Avoid working on ladders or at heights. Take showers instead of baths. Ensure the water temperature is not too high on the home water heater. Do not go swimming alone. Do not lock yourself in a room alone (i.e. bathroom). When caring for infants or small children, sit down when holding, feeding, or changing them to minimize risk of injury to the child in the event you have a seizure. Maintain good sleep hygiene. Avoid alcohol.   If you had Gastrointestinal Bleeding: Please ask your Primary MD to check a complete blood count within one week of discharge or at your next visit. Your endoscopic/colonoscopic biopsies that are pending at the time of discharge, will also need to followed by your Primary MD.  Get Medicines reviewed and adjusted. Please take all your medications with you for your next visit with your Primary MD  Please request your Primary MD to go over all hospital tests and procedure/radiological results at the follow up, please ask your Primary MD to get all Hospital records sent to his/her office.  If you experience worsening of your admission symptoms, develop shortness of breath, life threatening emergency, suicidal or homicidal thoughts you must seek medical attention immediately by calling 911 or calling your MD immediately  if symptoms less severe.  You must read complete  instructions/literature along with all the possible adverse reactions/side effects for all the Medicines you take and that have been prescribed to you. Take any new Medicines after you have completely understood and accpet all the possible adverse reactions/side effects.   Do not drive or operate heavy machinery when taking Pain medications.   Do not take more than prescribed Pain, Sleep and Anxiety Medications  Special Instructions: If you have  smoked or chewed Tobacco  in the last 2 yrs please stop smoking, stop any regular Alcohol  and or any Recreational drug use.  Wear Seat belts while driving.  Please note You were cared for by a hospitalist during your hospital stay. If you have any questions about your discharge medications or the care you received while you were in the hospital after you are discharged, you can call the unit and asked to speak with the hospitalist on call if the hospitalist that took care of you is not available. Once you are discharged, your primary care physician will handle any further medical issues. Please note that NO REFILLS for any discharge medications will be authorized once you are discharged, as it is imperative that you return to your primary care physician (or establish a relationship with a primary care physician if you do not have one) for your aftercare needs so that they can reassess your need for medications and monitor your lab values.  You can reach the hospitalist office at phone [phone] or fax [phone]   If you do not have a primary care physician, you can call [phone] for a physician referral.  Activity: As tolerated with Full fall precautions use walker/cane & assistance as needed    Diet: regular  Disposition Home

## 2023-09-25 NOTE — Discharge Summary (Signed)
Physician Discharge Summary  Amy Rowe:[id number] DOB: 1976/06/24 DOA: 09/22/2023  PCP: Oneita Hurt, No  Admit date: 09/22/2023 Discharge date: 09/25/2023  Admitted From: home Disposition:  home  Recommendations for Outpatient Follow-up:  Follow up with PCP in 1-2 weeks Continue Tamiflu and Levaquin for 3 more days  Home Health: none Equipment/Devices: none  Discharge Condition: stable CODE STATUS: Full code  HPI: Per admitting MD, Amy Rowe is a 48 y.o. female with medical history of asthma, generalized anxiety disorder, chronic smoker, depression, schizophrenia presented to emergency department with complaining of diagnosis of pneumonia in the urgent care.  Patient reported that she she has been not feeling well for several days.  Noticed increased dyspnea on exertion.  Patient went to the urgent care and have an imaging which showed pneumonia.  Patient was treated with albuterol, methylprednisone Rocephin ibuprofen and saline.  She also having new requirement of oxygen for liter.   Hospital Course / Discharge diagnoses: Principal Problem:   Pneumonia Active Problems:   Asthma, chronic obstructive, with acute exacerbation (HCC)   Sepsis due to pneumonia (HCC)   CAP (community acquired pneumonia)   AKI (acute kidney injury) (HCC)   Influenza A with pneumonia   Hyponatremia   Metabolic acidosis   Hypokalemia   Generalized anxiety disorder   Principal problem Sepsis secondary to right upper lobe pneumococcal pneumonia, influenza pneumonia -imaging on admission showed right upper lobe opacity, concerning for pneumonia.  She tested positive for influenza A, has been placed on Tamiflu, continue for total of 5 days. Urinary strep antigen is also positive, suggesting superimposed bacterial pneumonia, has been placed on antibiotics, continue for total of 5 days.  She has improved with treatment, is feeling better, able to ambulate in the hallway without difficulties, will be  discharged home in stable condition with antivirals, antibiotics and prednisone taper   Active problems Acute hypoxic respiratory failure in the setting of asthma exacerbation secondary to pneumonia - continue oxygen to maintain sats above 90%, wean off to room air as tolerated.  Had wheezing on admission, has been placed on steroids, continue a taper upon discharge.  Hypoxia resolved and she is stable on room air, ambulating  Acute kidney injury-creatinine 1.59 on admission, received fluids, creatinine now normalized Hypokalemia, hypophosphatemia-replaced Hyponatremia-due to dehydration, sodium normalized with fluids Diarrhea-secondary to influenza A.  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  She would benefit from weight loss  Discharge Instructions   Allergies as of 09/25/2023       Reactions   Other    ANTIBIOTIC-Name is unknown. States that reaction occurred along time ago that included having an infection after   Latex Swelling, Rash        Medication List     STOP taking these medications    methylPREDNISolone 4 MG Tbpk tablet Commonly known as: MEDROL DOSEPAK       TAKE these medications    albuterol 108 (90 Base) MCG/ACT inhaler Commonly known as: VENTOLIN HFA Inhale 2 puffs into the lungs every 6 (six) hours as needed for wheezing or shortness of breath.   albuterol (2.5 MG/3ML) 0.083% nebulizer solution Commonly known as: PROVENTIL Take 3 mLs (2.5 mg total) by nebulization every 4 (four) hours as needed for wheezing or shortness of breath.   citalopram 20 MG tablet Commonly known as: CELEXA Take 1 tablet (20 mg total) by mouth daily.   fluticasone 50  MCG/ACT nasal spray Commonly known as: FLONASE Place 1 spray into both nostrils daily as needed for allergies or rhinitis.   HYDROcodone bit-homatropine 5-1.5 MG/5ML  syrup Commonly known as: HYCODAN Take 5 mLs by mouth every 6 (six) hours as needed for cough.   levofloxacin 750 MG tablet Commonly known as: Levaquin Take 1 tablet (750 mg total) by mouth daily.   NYQUIL PO Take 2 each by mouth every 4 (four) hours as needed. Gel capsules   OLANZapine 10 MG tablet Commonly known as: ZYPREXA Take 1 tablet (10 mg total) by mouth at bedtime.   oseltamivir 75 MG capsule Commonly known as: TAMIFLU Take 1 capsule (75 mg total) by mouth 2 (two) times daily for 3 days.   predniSONE 10 MG tablet Commonly known as: DELTASONE Take 2 tablets (20 mg total) by mouth daily for 4 days.   PRIMATENE ASTHMA PO Take 1 tablet by mouth daily as needed.   traZODone 100 MG tablet Commonly known as: DESYREL Take 1 tablet (100 mg total) by mouth at bedtime as needed for sleep (x1 repeat PRN).         Consultations:   Procedures/Studies:  DG Chest 2 View Result Date: 09/22/2023 CLINICAL DATA:  Shortness of breath. EXAM: CHEST - 2 VIEW COMPARISON:  August 04, 2021. FINDINGS: The heart size and mediastinal contours are within normal limits. Large right upper lobe opacity is noted concerning for pneumonia. Mild bibasilar atelectasis or infiltrates are noted as well. The visualized skeletal structures are unremarkable. IMPRESSION: Large right upper lobe opacity is noted concerning for pneumonia. Bibasilar atelectasis or infiltrates are noted as well. Followup PA and lateral chest X-ray is recommended in 3-4 weeks following trial of antibiotic therapy to ensure resolution and exclude underlying malignancy. Electronically Signed   By: Lupita Raider M.D.   On: 09/22/2023 14:31     Subjective: - no chest pain, shortness of breath, no abdominal pain, nausea or vomiting.   Discharge Exam: BP (!) 143/90 (BP Location: Left Arm)   Pulse (!) 47   Temp 97.8 F (36.6 C) (Oral)   Resp 18   Ht 5\' 3"  (1.6 m)   Wt 106.6 kg   LMP  (LMP Unknown)   SpO2 94%   BMI 41.63  kg/m   General: Pt is alert, awake, not in acute distress Cardiovascular: RRR, S1/S2 +, no rubs, no gallops Respiratory: CTA bilaterally, no wheezing, no rhonchi Abdominal: Soft, NT, ND, bowel sounds + Extremities: no edema, no cyanosis    The results of significant diagnostics from this hospitalization (including imaging, microbiology, ancillary and laboratory) are listed below for reference.     Microbiology: Recent Results (from the past 240 hours)  Blood Culture (routine x 2)     Status: None (Preliminary result)   Collection Time: 09/22/23  6:48 PM   Specimen: BLOOD  Result Value Ref Range Status   Specimen Description   Final    BLOOD BLOOD LEFT WRIST Performed at [facility], [address] [place], [place] [address]    Special Requests   Final    BOTTLES DRAWN AEROBIC AND ANAEROBIC Blood Culture results may not be optimal due to an inadequate volume of blood received in culture bottles Performed at [facility], [address], [place], [place] [address]    Culture   Final    NO GROWTH 2 DAYS Performed at [facility], [address], [place], [place] [address]    Report  Status PENDING  Incomplete  Resp panel by RT-PCR (RSV, Flu A&B, Covid)     Status: Abnormal   Collection Time: 09/22/23  6:57 PM   Specimen: Nasal Swab  Result Value Ref Range Status   SARS Coronavirus 2 by RT PCR NEGATIVE NEGATIVE Final    Comment: (NOTE) SARS-CoV-2 target nucleic acids are NOT DETECTED.  The SARS-CoV-2 RNA is generally detectable in upper respiratory specimens during the acute phase of infection. The lowest concentration of SARS-CoV-2 viral copies this assay can detect is 138 copies/mL. A negative result does not preclude SARS-Cov-2 infection and should not be used as the sole basis for treatment or other patient management decisions. A negative result may occur with  improper specimen collection/handling, submission of specimen  other than nasopharyngeal swab, presence of viral mutation(s) within the areas targeted by this assay, and inadequate number of viral copies(<138 copies/mL). A negative result must be combined with clinical observations, patient history, and epidemiological information. The expected result is Negative.  Fact Sheet for Patients:  BloggerCourse.com  Fact Sheet for Healthcare Providers:  SeriousBroker.it  This test is no t yet approved or cleared by the [place] FDA and  has been authorized for detection and/or diagnosis of SARS-CoV-2 by FDA under an Emergency Use Authorization (EUA). This EUA will remain  in effect (meaning this test can be used) for the duration of the COVID-19 declaration under Section 564(b)(1) of the Act, 21 U.S.C.section 360bbb-3(b)(1), unless the authorization is terminated  or revoked sooner.       Influenza A by PCR POSITIVE (A) NEGATIVE Final   Influenza B by PCR NEGATIVE NEGATIVE Final    Comment: (NOTE) The Xpert Xpress SARS-CoV-2/FLU/RSV plus assay is intended as an aid in the diagnosis of influenza from Nasopharyngeal swab specimens and should not be used as a sole basis for treatment. Nasal washings and aspirates are unacceptable for Xpert Xpress SARS-CoV-2/FLU/RSV testing.  Fact Sheet for Patients: BloggerCourse.com  Fact Sheet for Healthcare Providers: SeriousBroker.it  This test is not yet approved or cleared by the [place] FDA and has been authorized for detection and/or diagnosis of SARS-CoV-2 by FDA under an Emergency Use Authorization (EUA). This EUA will remain in effect (meaning this test can be used) for the duration of the COVID-19 declaration under Section 564(b)(1) of the Act, 21 U.S.C. section 360bbb-3(b)(1), unless the authorization is terminated or revoked.     Resp Syncytial Virus by PCR NEGATIVE NEGATIVE Final     Comment: (NOTE) Fact Sheet for Patients: BloggerCourse.com  Fact Sheet for Healthcare Providers: SeriousBroker.it  This test is not yet approved or cleared by the [place] FDA and has been authorized for detection and/or diagnosis of SARS-CoV-2 by FDA under an Emergency Use Authorization (EUA). This EUA will remain in effect (meaning this test can be used) for the duration of the COVID-19 declaration under Section 564(b)(1) of the Act, 21 U.S.C. section 360bbb-3(b)(1), unless the authorization is terminated or revoked.  Performed at [facility], [address] [place], [place] [address]   Blood Culture (routine x 2)     Status: None (Preliminary result)   Collection Time: 09/22/23  7:04 PM   Specimen: BLOOD  Result Value Ref Range Status   Specimen Description   Final    BLOOD BLOOD LEFT FOREARM Performed at [facility], [address] [place], [place] [address]    Special Requests   Final    BOTTLES DRAWN AEROBIC AND ANAEROBIC Blood Culture adequate  volume Performed at [facility], [address], [place], [place] [address]    Culture   Final    NO GROWTH 2 DAYS Performed at [facility], [address], [place], [place] [address]    Report Status PENDING  Incomplete  Gastrointestinal Panel by PCR , Stool     Status: None   Collection Time: 09/23/23  3:52 AM   Specimen: Stool  Result Value Ref Range Status   Campylobacter species NOT DETECTED NOT DETECTED Final   Plesimonas shigelloides NOT DETECTED NOT DETECTED Final   Salmonella species NOT DETECTED NOT DETECTED Final   Yersinia enterocolitica NOT DETECTED NOT DETECTED Final   Vibrio species NOT DETECTED NOT DETECTED Final   Vibrio cholerae NOT DETECTED NOT DETECTED Final   Enteroaggregative E coli (EAEC) NOT DETECTED NOT DETECTED Final   Enteropathogenic E coli (EPEC) NOT DETECTED NOT DETECTED  Final   Enterotoxigenic E coli (ETEC) NOT DETECTED NOT DETECTED Final   Shiga like toxin producing E coli (STEC) NOT DETECTED NOT DETECTED Final   Shigella/Enteroinvasive E coli (EIEC) NOT DETECTED NOT DETECTED Final   Cryptosporidium NOT DETECTED NOT DETECTED Final   Cyclospora cayetanensis NOT DETECTED NOT DETECTED Final   Entamoeba histolytica NOT DETECTED NOT DETECTED Final   Giardia lamblia NOT DETECTED NOT DETECTED Final   Adenovirus F40/41 NOT DETECTED NOT DETECTED Final   Astrovirus NOT DETECTED NOT DETECTED Final   Norovirus GI/GII NOT DETECTED NOT DETECTED Final   Rotavirus A NOT DETECTED NOT DETECTED Final   Sapovirus (I, II, IV, and V) NOT DETECTED NOT DETECTED Final    Comment: Performed at [facility], [address] [place], [place] [address]     Labs: Basic Metabolic Panel: Recent Labs  Lab 09/22/23 1848 09/22/23 2335 09/23/23 0434 09/24/23 0729 09/25/23 0556  NA 131* 134* 137 139 136  K 2.8* 3.0* 3.1* 3.3* 4.3  CL 99 103 104 111 110  CO2 16* 21* 23 22 19*  GLUCOSE 178* 150* 164* 154* 194*  BUN 27* 26* 25* 20 25*  CREATININE 1.59* 1.18* 1.15* 0.92 1.01*  CALCIUM 7.7* 7.6* 8.1* 8.1* 8.3*  MG  --   --   --  2.1 2.3  PHOS  --   --   --  1.5* 3.3   Liver Function Tests: Recent Labs  Lab 09/22/23 1848 09/23/23 0434 09/24/23 0729  AST 57* 42* 94*  ALT 42 35 57*  ALKPHOS 53 53 77  BILITOT 1.1 0.8 0.6  PROT 6.5 6.0* 6.0*  ALBUMIN 2.7* 2.3* 2.2*   CBC: Recent Labs  Lab 09/22/23 1848 09/23/23 0434 09/24/23 0729  WBC 6.0 6.5 7.2  NEUTROABS 4.7  --   --   HGB 12.1 10.9* 9.7*  HCT 37.4 33.3* 29.8*  MCV 83.9 84.1 84.9  PLT 186 177 217   CBG: No results for input(s): "GLUCAP" in the last 168 hours. Hgb A1c No results for input(s): "HGBA1C" in the last 72 hours. Lipid Profile No results for input(s): "CHOL", "HDL", "LDLCALC", "TRIG", "CHOLHDL", "LDLDIRECT" in the last 72 hours. Thyroid function studies No results for input(s): "TSH",  "T4TOTAL", "T3FREE", "THYROIDAB" in the last 72 hours.  Invalid input(s): "FREET3" Urinalysis    Component Value Date/Time   COLORURINE YELLOW 09/23/2023 0030   APPEARANCEUR HAZY (A) 09/23/2023 0030   LABSPEC 1.008 09/23/2023 0030   PHURINE 5.0 09/23/2023 0030   GLUCOSEU NEGATIVE 09/23/2023 0030   HGBUR SMALL (A) 09/23/2023 0030   BILIRUBINUR NEGATIVE  09/23/2023 0030   KETONESUR NEGATIVE 09/23/2023 0030   PROTEINUR NEGATIVE 09/23/2023 0030   NITRITE NEGATIVE 09/23/2023 0030   LEUKOCYTESUR NEGATIVE 09/23/2023 0030    FURTHER DISCHARGE INSTRUCTIONS:   Get Medicines reviewed and adjusted: Please take all your medications with you for your next visit with your Primary MD   Laboratory/radiological data: Please request your Primary MD to go over all hospital tests and procedure/radiological results at the follow up, please ask your Primary MD to get all Hospital records sent to his/her office.   In some cases, they will be blood work, cultures and biopsy results pending at the time of your discharge. Please request that your primary care M.D. goes through all the records of your hospital data and follows up on these results.   Also Note the following: If you experience worsening of your admission symptoms, develop shortness of breath, life threatening emergency, suicidal or homicidal thoughts you must seek medical attention immediately by calling 911 or calling your MD immediately  if symptoms less severe.   You must read complete instructions/literature along with all the possible adverse reactions/side effects for all the Medicines you take and that have been prescribed to you. Take any new Medicines after you have completely understood and accpet all the possible adverse reactions/side effects.    Do not drive when taking Pain medications or sleeping medications (Benzodaizepines)   Do not take more than prescribed Pain, Sleep and Anxiety Medications. It is not advisable to combine  anxiety,sleep and pain medications without talking with your primary care practitioner   Special Instructions: If you have smoked or chewed Tobacco  in the last 2 yrs please stop smoking, stop any regular Alcohol  and or any Recreational drug use.   Wear Seat belts while driving.   Please note: You were cared for by a hospitalist during your hospital stay. Once you are discharged, your primary care physician will handle any further medical issues. Please note that NO REFILLS for any discharge medications will be authorized once you are discharged, as it is imperative that you return to your primary care physician (or establish a relationship with a primary care physician if you do not have one) for your post hospital discharge needs so that they can reassess your need for medications and monitor your lab values.  Time coordinating discharge: 35 minutes  SIGNED:  Pamella Pert, MD, PhD 09/25/2023, 7:34 AM

## 2023-09-25 NOTE — Plan of Care (Signed)
No acute events overnight. Problem: Education: Goal: Knowledge of General Education information will improve Description: Including pain rating scale, medication(s)/side effects and non-pharmacologic comfort measures Outcome: Progressing   Problem: Health Behavior/Discharge Planning: Goal: Ability to manage health-related needs will improve Outcome: Progressing   Problem: Clinical Measurements: Goal: Ability to maintain clinical measurements within normal limits will improve Outcome: Progressing Goal: Will remain free from infection Outcome: Progressing Goal: Diagnostic test results will improve Outcome: Progressing Goal: Respiratory complications will improve Outcome: Progressing Goal: Cardiovascular complication will be avoided Outcome: Progressing   Problem: Activity: Goal: Risk for activity intolerance will decrease Outcome: Progressing   Problem: Nutrition: Goal: Adequate nutrition will be maintained Outcome: Progressing   Problem: Coping: Goal: Level of anxiety will decrease Outcome: Progressing   Problem: Elimination: Goal: Will not experience complications related to bowel motility Outcome: Progressing Goal: Will not experience complications related to urinary retention Outcome: Progressing   Problem: Pain Managment: Goal: General experience of comfort will improve and/or be controlled Outcome: Progressing   Problem: Safety: Goal: Ability to remain free from injury will improve Outcome: Progressing   Problem: Skin Integrity: Goal: Risk for impaired skin integrity will decrease Outcome: Progressing   Problem: Activity: Goal: Ability to tolerate increased activity will improve Outcome: Progressing   Problem: Clinical Measurements: Goal: Ability to maintain a body temperature in the normal range will improve Outcome: Progressing   Problem: Respiratory: Goal: Ability to maintain adequate ventilation will improve Outcome: Progressing Goal: Ability to  maintain a clear airway will improve Outcome: Progressing

## 2023-09-26 LAB — LEGIONELLA PNEUMOPHILA SEROGP 1 UR AG: L. pneumophila Serogp 1 Ur Ag: NEGATIVE

## 2023-09-27 LAB — CULTURE, BLOOD (ROUTINE X 2)
Culture: NO GROWTH
Culture: NO GROWTH
Special Requests: ADEQUATE

## 2023-10-04 ENCOUNTER — Ambulatory Visit (HOSPITAL_COMMUNITY): Payer: Self-pay | Admitting: Student

## 2023-10-06 ENCOUNTER — Encounter (HOSPITAL_COMMUNITY): Payer: Self-pay

## 2023-10-06 ENCOUNTER — Telehealth (HOSPITAL_COMMUNITY): Payer: Self-pay | Admitting: Licensed Clinical Social Worker

## 2023-10-06 ENCOUNTER — Ambulatory Visit (HOSPITAL_COMMUNITY): Payer: 59 | Admitting: Licensed Clinical Social Worker

## 2023-10-06 NOTE — Telephone Encounter (Signed)
LCSW sent two text links to pt phone with no response for 8am virtual appointment  at 8 and 808. LCSW waited until 815 before disconnecting.

## 2023-10-13 ENCOUNTER — Ambulatory Visit (HOSPITAL_COMMUNITY): Payer: Self-pay | Admitting: Clinical
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
## 2023-10-17 ENCOUNTER — Other Ambulatory Visit: Payer: Self-pay

## 2023-10-17 ENCOUNTER — Ambulatory Visit (INDEPENDENT_AMBULATORY_CARE_PROVIDER_SITE_OTHER): Payer: 59 | Admitting: Psychiatry

## 2023-10-17 ENCOUNTER — Encounter (HOSPITAL_COMMUNITY): Payer: Self-pay | Admitting: Psychiatry

## 2023-10-17 DIAGNOSIS — F331 Major depressive disorder, recurrent, moderate: Secondary | ICD-10-CM | POA: Diagnosis not present

## 2023-10-17 DIAGNOSIS — Z658 Other specified problems related to psychosocial circumstances: Secondary | ICD-10-CM

## 2023-10-17 DIAGNOSIS — F209 Schizophrenia, unspecified: Secondary | ICD-10-CM

## 2023-10-17 MED ORDER — OLANZAPINE 10 MG PO TABS
10.0000 mg | ORAL_TABLET | Freq: Every day | ORAL | 0 refills | Status: DC
  Filled 2023-10-17 – 2023-11-06 (×2): qty 30, 30d supply, fill #0
  Filled 2023-12-15: qty 30, 30d supply, fill #1

## 2023-10-17 MED ORDER — CITALOPRAM HYDROBROMIDE 20 MG PO TABS
20.0000 mg | ORAL_TABLET | Freq: Every day | ORAL | 0 refills | Status: DC
  Filled 2023-10-17 – 2023-11-06 (×2): qty 30, 30d supply, fill #0
  Filled 2023-12-15: qty 30, 30d supply, fill #1

## 2023-10-17 MED ORDER — TRAZODONE HCL 100 MG PO TABS
50.0000 mg | ORAL_TABLET | Freq: Every evening | ORAL | 0 refills | Status: DC | PRN
  Filled 2023-10-17 – 2023-11-07 (×2): qty 30, 30d supply, fill #0
  Filled 2023-12-15: qty 30, 30d supply, fill #1

## 2023-10-17 NOTE — Patient Instructions (Addendum)
 Thank you for attending your appointment today.  -- We did not make any medication changes today. Please continue medications as prescribed. -- I have sent your medications to [facility]: [facility]  [address], [address], [place] [address]  [phone]   Please do not make any changes to medications without first discussing with your provider. If you are experiencing a psychiatric emergency, please call 911 or present to your nearest emergency department. Additional crisis, medication management, and therapy resources are included below.  [facility]  [address] [place], [place] [address] [phone] WALK-IN URGENT CARE 24/7 FOR ANYONE [address], [place], [place]  [phone] Fax: [phone] [email or web address] *Interpreters available *Accepts all insurance and uninsured for Urgent Care needs *Accepts Medicaid and uninsured for outpatient treatment (below)      ONLY FOR [facility]  Below:    Outpatient New Patient Assessment/Therapy Walk-ins:        Monday, Wednesday, and Thursday 8am until slots are full (first come, first served)                   New Patient Psychiatry/Medication Management        Monday-Friday 8am-11am (first come, first served)               For all walk-ins we ask that you arrive by 7:15am, because patients will be seen in the order of arrival.

## 2023-10-26 ENCOUNTER — Other Ambulatory Visit: Payer: Self-pay

## 2023-10-27 ENCOUNTER — Ambulatory Visit (INDEPENDENT_AMBULATORY_CARE_PROVIDER_SITE_OTHER): Payer: 59 | Admitting: Licensed Clinical Social Worker

## 2023-10-27 DIAGNOSIS — F209 Schizophrenia, unspecified: Secondary | ICD-10-CM | POA: Diagnosis not present

## 2023-10-27 NOTE — Progress Notes (Signed)
THERAPIST PROGRESS NOTE Virtual Visit via Video Note  I connected with Amy Rowe on 10/27/23 at  8:00 AM EST by a video enabled telemedicine application and verified that I am speaking with the correct person using two identifiers.  Location: Patient: [facility]  Provider: Providers Home    I discussed the limitations of evaluation and management by telemedicine and the availability of in person appointments. The patient expressed understanding and agreed to proceed.   I discussed the assessment and treatment plan with the patient. The patient was provided an opportunity to ask questions and all were answered. The patient agreed with the plan and demonstrated an understanding of the instructions.   The patient was advised to call back or seek an in-person evaluation if the symptoms worsen or if the condition fails to improve as anticipated.  I provided 40 minutes of non-face-to-face time during this encounter.   Amy Cooks, LCSW  Participation Level: Active  Behavioral Response: CasualAlertAnxious and Depressed  Type of Therapy: Individual Therapy  Treatment Goals addressed:  Active     OP Depression     LTG: Reduce frequency, intensity, and duration of depression symptoms so that daily functioning is improved (Progressing)     Start:  08/22/23    Expected End:  03/05/24         LTG: Amy Rowe will score less than 10 on the Patient Health Questionnaire (PHQ-9)  (Completed/Met)     Start:  08/22/23    Expected End:  03/05/24    Resolved:  10/27/23      STG: Amy Rowe will cooperate with a psychiatric evaluation in the next 60 day or by 10/06/23   (Completed/Met)     Start:  08/22/23    Expected End:  03/05/24    Resolved:  10/27/23      Identify 3 triggers for depression (Progressing)     Start:  08/22/23    Expected End:  03/05/24         Identify 3 coping skills for depression     Start:  08/22/23    Expected End:  03/05/24         Work with  Amy Rowe to track symptoms, triggers, and/or skill use through a mood chart, diary card, or journal     Start:  08/22/23         Encourage Amy Rowe to participate in recovery peer support activities weekly      Start:  08/22/23         Therapist will administer the PHQ-9 at weekly intervals for the next 8 weeks     Start:  08/22/23         Provide [place] educational information and reading material on dissociation, its causes, and symptoms     Start:  08/22/23         Work with Amy Rowe to identify the major components of a recent episode of depression: physical symptoms, major thoughts and images, and major behaviors they experienced     Start:  08/22/23              10/27/2023    8:07 AM 08/22/2023    8:18 AM  GAD 7 : Generalized Anxiety Score  Nervous, Anxious, on Edge 1 2  Control/stop worrying 1 2  Worry too much - different things 1 2  Trouble relaxing 1 1  Restless 0 1  Easily annoyed or irritable 1 1  Afraid - awful might happen 1 1  Total GAD 7  Score 6 10  Anxiety Difficulty Not difficult at all Somewhat difficult        10/27/2023    8:03 AM 08/22/2023    8:14 AM  Depression screen PHQ 2/9  Decreased Interest 3 2  Down, Depressed, Hopeless 0 1  PHQ - 2 Score 3 3  Altered sleeping 0 1  Tired, decreased energy 1 1  Change in appetite 0 2  Feeling bad or failure about yourself  1 2  Trouble concentrating 1 2  Moving slowly or fidgety/restless 1 1  Suicidal thoughts 0 1  PHQ-9 Score 7 13  Difficult doing work/chores Not difficult at all Somewhat difficult     ProgressTowards Goals: Progressing  Interventions: Supportive and Reframing  Suicidal/Homicidal: Nowithout intent/plan  Therapist Response:     Patient was alert and oriented x 5.  She was pleasant, cooperative, maintained good eye contact.  She engaged well in therapy session was dressed casually.  Amy Rowe presented with flat mood\affect.  Patient reports primary stressors as work, housing,  and motivation.  Patient reports that she makes $12 an hour at [employer].  She reports that she lives with her daughter and her boyfriend that also makes similar money at [employer].  Patient reports that they are currently living in a hotel to cost $400 weekly or $1600 monthly.  Patient reports that between the 3 of them they are able to make rent but it is a struggle.  Amy Rowe reports concern about finding more permanent housing.  She reports that she is currently not involved in the process because it causes her anxiety.  LCSW and patient discussed today about focusing on the things that she can control versus the things that she cannot control.  LCSW and patient spoke about today getting involved in the planning to help decrease anxiety and depression.  Other stressors for patient are motivation.  Patient reports today "I go to work I come home I go to sleep".  Patient reports that this is a struggle as she would like to be more engaged in things and do more of her wants rather than her needs.  LCSW encouraged patient to find that balance of wants versus needs.  LCSW and patient discussed today wants such as going shopping 1 time monthly for an item of her choosing.  LCSW and patient spoke about savings for that "want".  Amy Rowe reports today that she could set aside $5-$10 out of every check.   Intervention/plan: LCSW utilized motivational interviewing for open-ended questions, reflective listening, and positive affirmations.  LCSW utilized psycho analytic therapy for patient to express thoughts, feelings and emotions in session.  LCSW used person centered therapy for empowerment.  LCSW utilized cognitive behavioral therapy for cognitive restructuring and educating patient on cognitive distortions.  Plan: Return again in 4 weeks.  Diagnosis: Schizophrenia, unspecified type (HCC)  Collaboration of Care: Other none today  Patient/Guardian was advised Release of Information must be obtained prior to  any record release in order to collaborate their care with an outside provider. Patient/Guardian was advised if they have not already done so to contact the registration department to sign all necessary forms in order for [place] to release information regarding their care.   Consent: Patient/Guardian gives verbal consent for treatment and assignment of benefits for services provided during this visit. Patient/Guardian expressed understanding and agreed to proceed.   Amy Cooks, LCSW 10/27/2023

## 2023-11-07 ENCOUNTER — Other Ambulatory Visit: Payer: Self-pay

## 2023-11-28 ENCOUNTER — Ambulatory Visit (INDEPENDENT_AMBULATORY_CARE_PROVIDER_SITE_OTHER): Payer: MEDICAID | Admitting: Licensed Clinical Social Worker

## 2023-11-28 DIAGNOSIS — F209 Schizophrenia, unspecified: Secondary | ICD-10-CM | POA: Diagnosis not present

## 2023-11-28 NOTE — Progress Notes (Unsigned)
 THERAPIST PROGRESS NOTE  Virtual Visit via Video Note  I connected with Amy Rowe on 11/28/23 at  4:00 PM EDT by a video enabled telemedicine application and verified that I am speaking with the correct person using two identifiers.  Location: Patient: [name]  Provider: Providers Home    I discussed the limitations of evaluation and management by telemedicine and the availability of in person appointments. The patient expressed understanding and agreed to proceed.     I discussed the assessment and treatment plan with the patient. The patient was provided an opportunity to ask questions and all were answered. The patient agreed with the plan and demonstrated an understanding of the instructions.   The patient was advised to call back or seek an in-person evaluation if the symptoms worsen or if the condition fails to improve as anticipated.  I provided 40 minutes of non-face-to-face time during this encounter.   Weber Cooks, LCSW   Participation Level: Active  Behavioral Response: CasualAlertAnxious and Depressed  Type of Therapy: Individual Therapy  Treatment Goals addressed:  Active     OP Depression     LTG: Reduce frequency, intensity, and duration of depression symptoms so that daily functioning is improved (Progressing)     Start:  08/22/23    Expected End:  03/05/24         LTG: Amy Rowe will score less than 10 on the Patient Health Questionnaire (PHQ-9)  (Completed/Met)     Start:  08/22/23    Expected End:  03/05/24    Resolved:  10/27/23      STG: Amy Rowe will cooperate with a psychiatric evaluation in the next 60 day or by 10/06/23   (Completed/Met)     Start:  08/22/23    Expected End:  03/05/24    Resolved:  10/27/23      Identify 3 triggers for depression (Progressing)     Start:  08/22/23    Expected End:  03/05/24         Identify 3 coping skills for depression (Progressing)     Start:  08/22/23    Expected End:  03/05/24          Work with Amy Rowe to track symptoms, triggers, and/or skill use through a mood chart, diary card, or journal (Completed)     Start:  08/22/23    End:  11/29/23      Encourage Amy Rowe to participate in recovery peer support activities weekly  (Completed)     Start:  08/22/23    End:  11/29/23      Therapist will administer the PHQ-9 at weekly intervals for the next 8 weeks (Completed)     Start:  08/22/23    End:  11/29/23      Provide Amy Rowe educational information and reading material on dissociation, its causes, and symptoms (Completed)     Start:  08/22/23    End:  11/29/23      Work with Amy Rowe to identify the major components of a recent episode of depression: physical symptoms, major thoughts and images, and major behaviors they experienced (Completed)     Start:  08/22/23    End:  11/29/23         ProgressTowards Goals: Progressing  Interventions: CBT and Supportive  Suicidal/Homicidal: Nowithout intent/plan  Therapist Response:    Amy Rowe was alert and oriented x 5.  She presented today with flat and anxious mood\affect.  Patient was pleasant, cooperative, maintained good eye contact.  She  engaged well in therapy session and was dressed casually.  Patient reports today primary stressors as communication.  She reports that she has struggles setting boundaries with one of her roommates.  Amy Rowe reports that she feels like her roommate talks down to her.  Patient reports that she does not handle confrontation well and when this person talks to her with her tone of voice she feels like it is more confrontational.  Amy Rowe reports today that she would like to learn communication techniques to help her be more assertive and to learn how to set boundaries.  Intervention/plan: LCSW utilized psychoanalytic therapy for patient to express thoughts, feelings and emotions and judgmental environment.  LCSW provided patient worksheet for assertive communication techniques.  This  went over techniques such as tone of voice and clear and concise communication styles.  LCSW reviewed setting healthy boundaries such as knowing limits.  LCSW encouraged patient to advocate for her feelings while acknowledging other people's feelings and communication.  Plan: Return again in 4 weeks.  Diagnosis: Schizophrenia, unspecified type (HCC)  Collaboration of Care: Other None today   Patient/Guardian was advised Release of Information must be obtained prior to any record release in order to collaborate their care with an outside provider. Patient/Guardian was advised if they have not already done so to contact the registration department to sign all necessary forms in order for [place] to release information regarding their care.   Consent: Patient/Guardian gives verbal consent for treatment and assignment of benefits for services provided during this visit. Patient/Guardian expressed understanding and agreed to proceed.   Weber Cooks, LCSW 11/28/2023
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
## 2023-12-15 ENCOUNTER — Other Ambulatory Visit: Payer: Self-pay

## 2023-12-15 ENCOUNTER — Other Ambulatory Visit (HOSPITAL_COMMUNITY): Payer: Self-pay

## 2023-12-16 ENCOUNTER — Telehealth (HOSPITAL_COMMUNITY): Payer: MEDICAID | Admitting: Psychiatry

## 2023-12-16 ENCOUNTER — Other Ambulatory Visit: Payer: Self-pay

## 2023-12-16 ENCOUNTER — Encounter (HOSPITAL_COMMUNITY): Payer: Self-pay | Admitting: Psychiatry

## 2023-12-16 DIAGNOSIS — F331 Major depressive disorder, recurrent, moderate: Secondary | ICD-10-CM

## 2023-12-16 DIAGNOSIS — F209 Schizophrenia, unspecified: Secondary | ICD-10-CM

## 2023-12-16 DIAGNOSIS — Z79899 Other long term (current) drug therapy: Secondary | ICD-10-CM | POA: Diagnosis not present

## 2023-12-16 DIAGNOSIS — Z5181 Encounter for therapeutic drug level monitoring: Secondary | ICD-10-CM | POA: Diagnosis not present

## 2023-12-16 MED ORDER — CITALOPRAM HYDROBROMIDE 20 MG PO TABS
20.0000 mg | ORAL_TABLET | Freq: Every day | ORAL | 0 refills | Status: AC
  Filled 2023-12-16: qty 90, 90d supply, fill #0

## 2023-12-16 MED ORDER — OLANZAPINE 10 MG PO TABS
10.0000 mg | ORAL_TABLET | Freq: Every day | ORAL | 0 refills | Status: AC
Start: 2023-12-16 — End: ?
  Filled 2023-12-16: qty 90, 90d supply, fill #0

## 2023-12-16 MED ORDER — TRAZODONE HCL 100 MG PO TABS
50.0000 mg | ORAL_TABLET | Freq: Every evening | ORAL | 0 refills | Status: AC | PRN
  Filled 2023-12-16: qty 90, 90d supply, fill #0

## 2023-12-16 NOTE — Patient Instructions (Addendum)
 Thank you for attending your appointment today.  -- We did not make any medication changes today. Please continue medications as prescribed. -- Please reach out to [facility] resources at [phone] for case management.  Here is more information about this program and what they offer: [email or web address]   Please do not make any changes to medications without first discussing with your provider. If you are experiencing a psychiatric emergency, please call 911 or present to your nearest emergency department. Additional crisis, medication management, and therapy resources are included below.  [facility]  [address] [place], [place] [address] [phone] WALK-IN URGENT CARE 24/7 FOR ANYONE [address], [place], [place]  [phone] Fax: [phone] [email or web address] *Interpreters available *Accepts all insurance and uninsured for Urgent Care needs *Accepts Medicaid and uninsured for outpatient treatment (below)      ONLY FOR [facility]  Below:    Outpatient New Patient Assessment/Therapy Walk-ins:        Monday, Wednesday, and Thursday 8am until slots are full (first come, first served)                   New Patient Psychiatry/Medication Management        Monday-Friday 8am-11am (first come, first served)               For all walk-ins we ask that you arrive by 7:15am, because patients will be seen in the order of arrival.

## 2024-01-03 ENCOUNTER — Ambulatory Visit (HOSPITAL_COMMUNITY): Payer: MEDICAID | Admitting: Licensed Clinical Social Worker

## 2024-01-03 DIAGNOSIS — F411 Generalized anxiety disorder: Secondary | ICD-10-CM

## 2024-01-03 DIAGNOSIS — F331 Major depressive disorder, recurrent, moderate: Secondary | ICD-10-CM

## 2024-01-03 DIAGNOSIS — F209 Schizophrenia, unspecified: Secondary | ICD-10-CM
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
## 2024-01-18 ENCOUNTER — Other Ambulatory Visit (HOSPITAL_COMMUNITY): Payer: MEDICAID

## 2024-01-25 ENCOUNTER — Other Ambulatory Visit (HOSPITAL_COMMUNITY): Payer: MEDICAID

## 2024-02-02 ENCOUNTER — Ambulatory Visit (HOSPITAL_COMMUNITY): Payer: MEDICAID | Admitting: Licensed Clinical Social Worker

## 2024-02-08 ENCOUNTER — Other Ambulatory Visit (HOSPITAL_COMMUNITY): Payer: MEDICAID

## 2024-02-24 ENCOUNTER — Telehealth (HOSPITAL_COMMUNITY): Payer: MEDICAID | Admitting: Psychiatry
